# Patient Record
Sex: Male | Born: 1975 | Race: Black or African American | Hispanic: No | Marital: Single | Smoking: Current every day smoker
Health system: Southern US, Community
[De-identification: ages and names within clinical notes are randomized; demographics above are authoritative.]

## PROBLEM LIST (undated history)

## (undated) DIAGNOSIS — I2510 Atherosclerotic heart disease of native coronary artery without angina pectoris: Secondary | ICD-10-CM

## (undated) DIAGNOSIS — B019 Varicella without complication: Secondary | ICD-10-CM

## (undated) DIAGNOSIS — I10 Essential (primary) hypertension: Secondary | ICD-10-CM

## (undated) DIAGNOSIS — C801 Malignant (primary) neoplasm, unspecified: Secondary | ICD-10-CM

## (undated) DIAGNOSIS — N200 Calculus of kidney: Secondary | ICD-10-CM

## (undated) DIAGNOSIS — E785 Hyperlipidemia, unspecified: Secondary | ICD-10-CM

## (undated) HISTORY — DX: Malignant (primary) neoplasm, unspecified: C80.1

## (undated) HISTORY — DX: Hyperlipidemia, unspecified: E78.5

## (undated) HISTORY — DX: Calculus of kidney: N20.0

## (undated) HISTORY — DX: Essential (primary) hypertension: I10

## (undated) HISTORY — DX: Varicella without complication: B01.9

## (undated) HISTORY — PX: HERNIA REPAIR: SHX51

## (undated) HISTORY — DX: Atherosclerotic heart disease of native coronary artery without angina pectoris: I25.10

---

## 1997-10-19 ENCOUNTER — Ambulatory Visit (HOSPITAL_COMMUNITY): Admission: EM | Admit: 1997-10-19 | Discharge: 1997-10-19 | Payer: Self-pay | Admitting: Endocrinology

## 1999-04-13 ENCOUNTER — Emergency Department (HOSPITAL_COMMUNITY): Admission: EM | Admit: 1999-04-13 | Discharge: 1999-04-14 | Payer: Self-pay | Admitting: *Deleted

## 2004-07-07 ENCOUNTER — Emergency Department (HOSPITAL_COMMUNITY): Admission: EM | Admit: 2004-07-07 | Discharge: 2004-07-07 | Payer: Self-pay | Admitting: Emergency Medicine

## 2014-11-13 ENCOUNTER — Ambulatory Visit: Payer: Self-pay | Admitting: Family Medicine

## 2014-11-20 ENCOUNTER — Ambulatory Visit (INDEPENDENT_AMBULATORY_CARE_PROVIDER_SITE_OTHER): Payer: 59 | Admitting: Family Medicine

## 2014-11-20 ENCOUNTER — Encounter: Payer: Self-pay | Admitting: Family Medicine

## 2014-11-20 VITALS — BP 132/90 | HR 78 | Temp 98.5°F | Ht 72.0 in | Wt 200.0 lb

## 2014-11-20 DIAGNOSIS — I10 Essential (primary) hypertension: Secondary | ICD-10-CM | POA: Insufficient documentation

## 2014-11-20 DIAGNOSIS — Z23 Encounter for immunization: Secondary | ICD-10-CM | POA: Diagnosis not present

## 2014-11-20 DIAGNOSIS — Z720 Tobacco use: Secondary | ICD-10-CM | POA: Diagnosis not present

## 2014-11-20 DIAGNOSIS — F17200 Nicotine dependence, unspecified, uncomplicated: Secondary | ICD-10-CM | POA: Insufficient documentation

## 2014-11-20 NOTE — Progress Notes (Signed)
[name], MD Phone: [phone]  Subjective:  Patient presents today to establish care. Pediatrician was last regular doctor. Chief complaint-noted.   Tested for HIV in past- no new partners since testing 13 years ago Doesn't remember last tetanus- given today  exercies 3x a week- 1-5 days a week 20 minutes- jog or walk 7 hours sleep, 4 people in home Overweight - has been in 180s before and feels great  See problem oriented charting Pertinent ROS- no chest pain, shortness of breath, blurry vision, headache, nausea. Some balance issues/dizziness when started new job. Very stressful at first- has improved now/resolved.   The following were reviewed and entered/updated in epic: Past Medical History  Diagnosis Date  . Kidney stone     around age 57, peed them out  . Hypertension     diagnosed at work screenings  . Chicken pox    Patient Active Problem List   Diagnosis Date Noted  . Current smoker 11/20/2014    Priority: High  . Hypertension     Priority: Medium   Past Surgical History  Procedure Laterality Date  . Hernia repair      around 48, bilateral    Family History  Problem Relation Age of Onset  . Stroke Mother     early 67s, drop foot  . Hypertension Mother   . Breast cancer Maternal Aunt     Medications- reviewed and updated No current outpatient prescriptions on file.   No current facility-administered medications for this visit.    Allergies-reviewed and updated No Known Allergies  Social History   Social History  . Marital Status: Single    Spouse Name: N/A  . Number of Children: N/A  . Years of Education: N/A   Social History Main Topics  . Smoking status: Current Every Day Smoker -- 0.60 packs/day for 13 years    Types: Cigarettes  . Smokeless tobacco: Not on file  . Alcohol Use: 3.0 - 6.0 oz/week    5-10 Standard drinks or equivalent per week  . Drug Use: No  . Sexual Activity:    Partners: Female   Other Topics Concern  .  Not on file   Social History Narrative   Family: Single. Girlfriend and 3 kids. 4 people in home. Son that lives in [place]- good situation with former partner.       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       Hobbies: football, basketball with son at park, bowl at the park    ROS--See HPI , otherwise full ROS was completed and negative except as noted above  Objective: BP 132/90 mmHg  Pulse 78  Temp(Src) 98.5 F (36.9 C)  Ht 6' (1.829 m)  Wt 200 lb (90.719 kg)  BMI 27.12 kg/m2 Gen: NAD, resting comfortably HEENT: Mucous membranes are moist. Oropharynx normal. TM normal. Eyes: sclera with some discoloration (since childhood) and lids normal, PERRLA Neck: no thyromegaly, no cervical lymphadenopathy CV: RRR no murmurs rubs or gallops Lungs: CTAB no crackles, wheeze, rhonchi Abdomen: soft/nontender/nondistended/normal bowel sounds. No rebound or guarding.  Ext: no edema, 2+ PT pulses Skin: warm, dry Neuro: 5/5 strength in upper and lower extremities, normal gait, normal reflexes   Assessment/Plan:  Hypertension S: mild poor control. diagnosed at work screenings. Has had 2 checks in December at 141/95 and in June of this year at  140/88 BP Readings from Last 3 Encounters:  11/20/14 132/90  A/P: Mild elevations in office. We are going to target 10 lbs weight down, DASH eating program, exercise 3-5 days a week for 30 minutes, currently 1-5days for20 mins. ecnouraged smoking cessation as well but patient not ready. 3-4 months physical - consider meds if not controlled.    Current smoker S: 1/2 to 3/4 PPD. Not ready to quit A/P: advised cessation. Discussed relation to BP, mother also with CVA so higher risk. Continue to push hard as this is biggest health issues. Never tried to quit so a lot of options here.    3-4 month CPE  Orders Placed This  Encounter  Procedures  . Tdap vaccine greater than or equal to 7yo IM

## 2014-11-20 NOTE — Assessment & Plan Note (Signed)
S: 1/2 to 3/4 PPD. Not ready to quit A/P: advised cessation. Discussed relation to BP, mother also with CVA so higher risk. Continue to push hard as this is biggest health issues. Never tried to quit so a lot of options here.

## 2014-11-20 NOTE — Patient Instructions (Signed)
Medication Instructions:  No medicines needed (yet- need to get blood pressure down)  Other Instructions:  Concerned about your blood pressure- see dash eating plan below, lets work on 3-5 days a week walking for 30 minutes, try to get weight down about 10 lbs  Quitting smoking will help control blood pressure as well. Would strongly consider this. One of the best things you can do for your health.   Labwork: A few days before your physical- schedule today  Testing/Procedures/Immunizations: Declined flu  Tdap (Tetanus shot today)  Follow-Up (all visit scheduling, rescheduling, cancellations including labs should be scheduled at front desk): Physical about 3-4 months from now  Sooner if you need [place] or if you have new or worsening symptoms    DASH Eating Plan DASH stands for "Dietary Approaches to Stop Hypertension." The DASH eating plan is a healthy eating plan that has been shown to reduce high blood pressure (hypertension). Additional health benefits may include reducing the risk of type 2 diabetes mellitus, heart disease, and stroke. The DASH eating plan may also help with weight loss. WHAT DO I NEED TO KNOW ABOUT THE DASH EATING PLAN? For the DASH eating plan, you will follow these general guidelines:  Choose foods with a percent daily value for sodium of less than 5% (as listed on the food label).  Use salt-free seasonings or herbs instead of table salt or sea salt.  Check with your health care provider or pharmacist before using salt substitutes.  Eat lower-sodium products, often labeled as "lower sodium" or "no salt added."  Eat fresh foods.  Eat more vegetables, fruits, and low-fat dairy products.  Choose whole grains. Look for the word "whole" as the first word in the ingredient list.  Choose fish and skinless chicken or [place] more often than red meat. Limit fish, poultry, and meat to 6 oz (170 g) each day.  Limit sweets, desserts, sugars, and sugary  drinks.  Choose heart-healthy fats.  Limit cheese to 1 oz (28 g) per day.  Eat more home-cooked food and less restaurant, buffet, and fast food.  Limit fried foods.  Cook foods using methods other than frying.  Limit canned vegetables. If you do use them, rinse them well to decrease the sodium.  When eating at a restaurant, ask that your food be prepared with less salt, or no salt if possible. WHAT FOODS CAN I EAT? Seek help from a dietitian for individual calorie needs. Grains Whole grain or whole wheat bread. Brown rice. Whole grain or whole wheat pasta. Quinoa, bulgur, and whole grain cereals. Low-sodium cereals. Corn or whole wheat flour tortillas. Whole grain cornbread. Whole grain crackers. Low-sodium crackers. Vegetables Fresh or frozen vegetables (raw, steamed, roasted, or grilled). Low-sodium or reduced-sodium tomato and vegetable juices. Low-sodium or reduced-sodium tomato sauce and paste. Low-sodium or reduced-sodium canned vegetables.  Fruits All fresh, canned (in natural juice), or frozen fruits. Meat and Other Protein Products Ground beef (85% or leaner), grass-fed beef, or beef trimmed of fat. Skinless chicken or [place]. Ground chicken or [place]. Pork trimmed of fat. All fish and seafood. Eggs. Dried beans, peas, or lentils. Unsalted nuts and seeds. Unsalted canned beans. Dairy Low-fat dairy products, such as skim or 1% milk, 2% or reduced-fat cheeses, low-fat ricotta or cottage cheese, or plain low-fat yogurt. Low-sodium or reduced-sodium cheeses. Fats and Oils Tub margarines without trans fats. Light or reduced-fat mayonnaise and salad dressings (reduced sodium). Avocado. Safflower, olive, or canola oils. Natural peanut or almond butter. Other Unsalted popcorn  and pretzels. The items listed above may not be a complete list of recommended foods or beverages. Contact your dietitian for more options. WHAT FOODS ARE NOT RECOMMENDED? Grains White bread. White pasta.  White rice. Refined cornbread. Bagels and croissants. Crackers that contain trans fat. Vegetables Creamed or fried vegetables. Vegetables in a cheese sauce. Regular canned vegetables. Regular canned tomato sauce and paste. Regular tomato and vegetable juices. Fruits Dried fruits. Canned fruit in light or heavy syrup. Fruit juice. Meat and Other Protein Products Fatty cuts of meat. Ribs, chicken wings, bacon, sausage, bologna, salami, chitterlings, fatback, hot dogs, bratwurst, and packaged luncheon meats. Salted nuts and seeds. Canned beans with salt. Dairy Whole or 2% milk, cream, half-and-half, and cream cheese. Whole-fat or sweetened yogurt. Full-fat cheeses or blue cheese. Nondairy creamers and whipped toppings. Processed cheese, cheese spreads, or cheese curds. Condiments Onion and garlic salt, seasoned salt, table salt, and sea salt. Canned and packaged gravies. Worcestershire sauce. Tartar sauce. Barbecue sauce. Teriyaki sauce. Soy sauce, including reduced sodium. Steak sauce. Fish sauce. Oyster sauce. Cocktail sauce. Horseradish. Ketchup and mustard. Meat flavorings and tenderizers. Bouillon cubes. Hot sauce. Tabasco sauce. Marinades. Taco seasonings. Relishes. Fats and Oils Butter, stick margarine, lard, shortening, ghee, and bacon fat. Coconut, palm kernel, or palm oils. Regular salad dressings. Other Pickles and olives. Salted popcorn and pretzels. The items listed above may not be a complete list of foods and beverages to avoid. Contact your dietitian for more information. WHERE CAN I FIND MORE INFORMATION? National Heart, Lung, and Blood Institute: travelstabloid.com Document Released: 02/25/2011 Document Revised: 07/23/2013 Document Reviewed: 01/10/2013 [facility] Patient Information 2015 [place], [place]. This information is not intended to replace advice given to you by your health care provider. Make sure you discuss any questions you have with your  health care provider.

## 2014-11-20 NOTE — Assessment & Plan Note (Signed)
S: mild poor control. diagnosed at work screenings. Has had 2 checks in December at 141/95 and in June of this year at 140/88 BP Readings from Last 3 Encounters:  11/20/14 132/90  A/P: Mild elevations in office. We are going to target 10 lbs weight down, DASH eating program, exercise 3-5 days a week for 30 minutes, currently 1-5days for20 mins. ecnouraged smoking cessation as well but patient not ready. 3-4 months physical - consider meds if not controlled.

## 2015-02-26 ENCOUNTER — Other Ambulatory Visit: Payer: 59

## 2015-03-06 ENCOUNTER — Encounter: Payer: Self-pay | Admitting: Family Medicine

## 2015-03-06 ENCOUNTER — Other Ambulatory Visit (INDEPENDENT_AMBULATORY_CARE_PROVIDER_SITE_OTHER): Payer: 59

## 2015-03-06 ENCOUNTER — Ambulatory Visit (INDEPENDENT_AMBULATORY_CARE_PROVIDER_SITE_OTHER): Payer: 59 | Admitting: Family Medicine

## 2015-03-06 VITALS — BP 142/88 | HR 98 | Temp 98.4°F | Ht 72.0 in | Wt 196.0 lb

## 2015-03-06 DIAGNOSIS — Z0000 Encounter for general adult medical examination without abnormal findings: Secondary | ICD-10-CM | POA: Diagnosis not present

## 2015-03-06 DIAGNOSIS — E785 Hyperlipidemia, unspecified: Secondary | ICD-10-CM | POA: Insufficient documentation

## 2015-03-06 LAB — HEPATIC FUNCTION PANEL
ALT: 49 U/L (ref 0–53)
AST: 25 U/L (ref 0–37)
Albumin: 4.8 g/dL (ref 3.5–5.2)
Alkaline Phosphatase: 69 U/L (ref 39–117)
Bilirubin, Direct: 0.1 mg/dL (ref 0.0–0.3)
TOTAL PROTEIN: 7.2 g/dL (ref 6.0–8.3)
Total Bilirubin: 0.5 mg/dL (ref 0.2–1.2)

## 2015-03-06 LAB — CBC WITH DIFFERENTIAL/PLATELET
BASOS PCT: 0.7 % (ref 0.0–3.0)
Basophils Absolute: 0.1 10*3/uL (ref 0.0–0.1)
EOS PCT: 2.4 % (ref 0.0–5.0)
Eosinophils Absolute: 0.2 10*3/uL (ref 0.0–0.7)
HEMATOCRIT: 48.9 % (ref 39.0–52.0)
HEMOGLOBIN: 16.2 g/dL (ref 13.0–17.0)
LYMPHS PCT: 20.8 % (ref 12.0–46.0)
Lymphs Abs: 1.4 10*3/uL (ref 0.7–4.0)
MCHC: 33.1 g/dL (ref 30.0–36.0)
MCV: 85.7 fl (ref 78.0–100.0)
MONOS PCT: 9.5 % (ref 3.0–12.0)
Monocytes Absolute: 0.6 10*3/uL (ref 0.1–1.0)
Neutro Abs: 4.5 10*3/uL (ref 1.4–7.7)
Neutrophils Relative %: 66.6 % (ref 43.0–77.0)
Platelets: 310 10*3/uL (ref 150.0–400.0)
RBC: 5.71 Mil/uL (ref 4.22–5.81)
RDW: 14.4 % (ref 11.5–15.5)
WBC: 6.8 10*3/uL (ref 4.0–10.5)

## 2015-03-06 LAB — POCT URINALYSIS DIPSTICK
Bilirubin, UA: NEGATIVE
Glucose, UA: NEGATIVE
KETONES UA: NEGATIVE
LEUKOCYTES UA: NEGATIVE
Nitrite, UA: NEGATIVE
PH UA: 5.5
SPEC GRAV UA: 1.025
Urobilinogen, UA: 0.2

## 2015-03-06 LAB — LIPID PANEL
Cholesterol: 252 mg/dL — ABNORMAL HIGH (ref 0–200)
HDL: 43.1 mg/dL (ref >39.00)
LDL Cholesterol: 188 mg/dL — ABNORMAL HIGH (ref 0–99)
NONHDL: 208.97
Total CHOL/HDL Ratio: 6
Triglycerides: 107 mg/dL (ref 0.0–149.0)
VLDL: 21.4 mg/dL (ref 0.0–40.0)

## 2015-03-06 LAB — BASIC METABOLIC PANEL
BUN: 13 mg/dL (ref 6–23)
CHLORIDE: 104 meq/L (ref 96–112)
CO2: 31 mEq/L (ref 19–32)
Calcium: 10 mg/dL (ref 8.4–10.5)
Creatinine, Ser: 1.11 mg/dL (ref 0.40–1.50)
GFR: 94.55 mL/min (ref >60.00)
Glucose, Bld: 102 mg/dL — ABNORMAL HIGH (ref 70–99)
POTASSIUM: 4.9 meq/L (ref 3.5–5.1)
SODIUM: 141 meq/L (ref 135–145)

## 2015-03-06 LAB — TSH: TSH: 1.41 u[IU]/mL (ref 0.35–4.50)

## 2015-03-06 NOTE — Patient Instructions (Addendum)
Blood pressure remains high. Great job losing 4 lbs and exercising. The next step is to quit smoking and I am hopeful your blood pressure will be controlled.   If you are going to use gum Use 41m patch daily for a month, then 73mpatch for next month, then stop.   With this, can use up to 4-5 pieces of nicotine gum. After done with patch, try to slowly stop the gum as well  Cholesterol also high and at risk for diabetes- we have to quit smoking to reduce your risk for heart attack and stroke

## 2015-03-06 NOTE — Progress Notes (Signed)
Daryl Reddish, MD Phone: [phone]  Subjective:  Patient presents today for their annual physical. Chief complaint-noted.   See problem oriented charting- ROS- full  review of systems was completed and negative including No chest pain or shortness of breath. No headache or blurry vision.   The following were reviewed and entered/updated in epic: Past Medical History  Diagnosis Date  . Kidney stone     around age 39, peed them out  . Hypertension     diagnosed at work screenings  . Chicken pox    Patient Active Problem List   Diagnosis Date Noted  . Current smoker 11/20/2014    Priority: High  . Hyperlipidemia 03/06/2015    Priority: Medium  . Hypertension     Priority: Medium   Past Surgical History  Procedure Laterality Date  . Hernia repair      around 53, bilateral    Family History  Problem Relation Age of Onset  . Stroke Mother     early 60s, drop foot  . Hypertension Mother   . Breast cancer Maternal Aunt     Medications- reviewed and updated No current outpatient prescriptions on file.   No current facility-administered medications for this visit.    Allergies-reviewed and updated No Known Allergies  Social History   Social History  . Marital Status: Single    Spouse Name: N/A  . Number of Children: N/A  . Years of Education: N/A   Social History Main Topics  . Smoking status: Current Every Day Smoker -- 0.60 packs/day for 13 years    Types: Cigarettes  . Smokeless tobacco: None  . Alcohol Use: 3.0 - 6.0 oz/week    5-10 Standard drinks or equivalent per week  . Drug Use: No  . Sexual Activity:    Partners: Female   Other Topics Concern  . None   Social History Narrative   Family: Single. Girlfriend and 3 kids. 4 people in home. Son that lives in [place]- good situation with former partner.       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       Hobbies: football, basketball with son at park, bowl at the park    ROS--See HPI   Objective: BP 142/88 mmHg  Pulse 98  Temp(Src) 98.4 F (36.9 C)  Ht 6' (1.829 m)  Wt 196 lb (88.905 kg)  BMI 26.58 kg/m2 Gen: NAD, resting comfortably HEENT: Mucous membranes are moist. Oropharynx normal Neck: no thyromegaly CV: RRR no murmurs rubs or gallops Lungs: CTAB no crackles, wheeze, rhonchi Abdomen: soft/nontender/nondistended/normal bowel sounds. No rebound or guarding.  Ext: no edema Skin: warm, dry Neuro: grossly normal, moves all extremities, PERRLA  Assessment/Plan:  39 y.o. male presenting for annual physical.  Health Maintenance counseling: 1. Anticipatory guidance: Patient counseled regarding regular dental exams, eye exams, wearing seatbelts.  2. Risk factor reduction:  Advised patient of need for regular exercise and diet rich and fruits and vegetables to reduce risk of heart attack and stroke. 3 days a week over an hour. Carb heavy diet is going to work on. But not a lot of simple sugars 3. Immunizations/screenings/ancillary studies- declined flu Immunization History  Administered Date(s) Administered  . Tdap 11/20/2014  4. Prostate cancer screening- start at age 51  5. Colon cancer screening - start at age 18  Smoker- not ready to quit.  Strongly encouraged given risks- HLD, hyperglycemia, smoker, hypertnesion. AVS plan-giving 4 months to quit or start BP meds if still high  Diastolic BP better with weight loss but SBP slightly higher BP Readings from Last 3 Encounters:  03/06/15 142/88  11/20/14 132/90   Wt Readings from Last 3 Encounters:  03/06/15 196 lb (88.905 kg)  11/20/14 200 lb (90.719 kg)   4 month follow up Repeat UA at visit under hematuria

## 2015-06-29 ENCOUNTER — Emergency Department (HOSPITAL_COMMUNITY)
Admission: EM | Admit: 2015-06-29 | Discharge: 2015-06-29 | Disposition: A | Discharge status: Home / Self Care | Payer: 59 | Attending: Emergency Medicine | Admitting: Emergency Medicine

## 2015-06-29 ENCOUNTER — Encounter (HOSPITAL_COMMUNITY): Payer: Self-pay | Admitting: Nurse Practitioner

## 2015-06-29 ENCOUNTER — Emergency Department (HOSPITAL_COMMUNITY): Payer: 59

## 2015-06-29 DIAGNOSIS — R002 Palpitations: Secondary | ICD-10-CM | POA: Insufficient documentation

## 2015-06-29 DIAGNOSIS — Z87442 Personal history of urinary calculi: Secondary | ICD-10-CM | POA: Diagnosis not present

## 2015-06-29 DIAGNOSIS — R0789 Other chest pain: Secondary | ICD-10-CM | POA: Diagnosis not present

## 2015-06-29 DIAGNOSIS — I10 Essential (primary) hypertension: Secondary | ICD-10-CM | POA: Diagnosis not present

## 2015-06-29 DIAGNOSIS — M791 Myalgia: Secondary | ICD-10-CM | POA: Diagnosis not present

## 2015-06-29 DIAGNOSIS — Z8619 Personal history of other infectious and parasitic diseases: Secondary | ICD-10-CM | POA: Diagnosis not present

## 2015-06-29 DIAGNOSIS — F17210 Nicotine dependence, cigarettes, uncomplicated: Secondary | ICD-10-CM | POA: Diagnosis not present

## 2015-06-29 DIAGNOSIS — R110 Nausea: Secondary | ICD-10-CM | POA: Diagnosis not present

## 2015-06-29 DIAGNOSIS — M25512 Pain in left shoulder: Secondary | ICD-10-CM | POA: Insufficient documentation

## 2015-06-29 DIAGNOSIS — R079 Chest pain, unspecified: Secondary | ICD-10-CM | POA: Diagnosis present

## 2015-06-29 LAB — BASIC METABOLIC PANEL
ANION GAP: 10 (ref 5–15)
BUN: 12 mg/dL (ref 6–20)
CALCIUM: 9.6 mg/dL (ref 8.9–10.3)
CO2: 27 mmol/L (ref 22–32)
CREATININE: 1.03 mg/dL (ref 0.61–1.24)
Chloride: 103 mmol/L (ref 101–111)
Glucose, Bld: 110 mg/dL — ABNORMAL HIGH (ref 65–99)
Potassium: 3.7 mmol/L (ref 3.5–5.1)
SODIUM: 140 mmol/L (ref 135–145)

## 2015-06-29 LAB — CBC
HCT: 46.5 % (ref 39.0–52.0)
Hemoglobin: 15.3 g/dL (ref 13.0–17.0)
MCH: 28.4 pg (ref 26.0–34.0)
MCHC: 32.9 g/dL (ref 30.0–36.0)
MCV: 86.3 fL (ref 78.0–100.0)
Platelets: 281 K/uL (ref 150–400)
RBC: 5.39 MIL/uL (ref 4.22–5.81)
RDW: 14.2 % (ref 11.5–15.5)
WBC: 5.6 K/uL (ref 4.0–10.5)

## 2015-06-29 LAB — I-STAT TROPONIN, ED: Troponin i, poc: 0 ng/mL (ref 0.00–0.08)

## 2015-06-29 MED ORDER — IBUPROFEN 200 MG PO TABS
400.0000 mg | ORAL_TABLET | Freq: Once | ORAL | Status: AC
  Administered 2015-06-29: 400 mg via ORAL
  Filled 2015-06-29: qty 2

## 2015-06-29 MED ORDER — LORAZEPAM 1 MG PO TABS
0.5000 mg | ORAL_TABLET | Freq: Once | ORAL | Status: AC
  Administered 2015-06-29: 0.5 mg via ORAL
  Filled 2015-06-29: qty 1

## 2015-06-29 NOTE — Discharge Instructions (Signed)

## 2015-06-29 NOTE — ED Provider Notes (Signed)
CSN: [id number]     Arrival date & time 06/29/15  1229 History   First MD Initiated Contact with Patient 06/29/15 1236     Chief Complaint  Patient presents with  . Chest Pain    HPI Comments: 40 year old male who presents with chest pain since 7PM last night. He states it is on the left side of his chest and radiates to his shoulder. It feels like a tightness and sometimes a pinch. Reports associated nausea and the feeling of his heart fluttering. He states he thought his symptoms may be related to gas so took gas relief med which did not provide relief. He states he went to a funeral yesterday for his step-mom and started to feel flushed when driving home. Denies feeling anxious. Denies fever, chills, diaphoresis, URI symptoms, SOB, abdominal pain, vomiting, or diarrhea. No family hx of CAD/MI. Pt does have HTN and HLD which he is currently not being treated for and is a current smoker.   Patient is a 40 y.o. male presenting with chest pain.  Chest Pain Associated symptoms: nausea and palpitations   Associated symptoms: no abdominal pain, no cough, no diaphoresis, no fever, no shortness of breath and not vomiting     Past Medical History  Diagnosis Date  . Kidney stone     around age 63, peed them out  . Hypertension     diagnosed at work screenings  . Chicken pox    Past Surgical History  Procedure Laterality Date  . Hernia repair      around 39, bilateral   Family History  Problem Relation Age of Onset  . Stroke Mother     early 55s, drop foot  . Hypertension Mother   . Breast cancer Maternal Aunt    Social History  Substance Use Topics  . Smoking status: Current Every Day Smoker -- 0.60 packs/day for 13 years    Types: Cigarettes  . Smokeless tobacco: None  . Alcohol Use: 3.0 - 6.0 oz/week    5-10 Standard drinks or equivalent per week    Review of Systems  Constitutional: Negative for fever, chills and diaphoresis.  Respiratory: Negative for cough and shortness of  breath.   Cardiovascular: Positive for chest pain and palpitations. Negative for leg swelling.  Gastrointestinal: Positive for nausea. Negative for vomiting, abdominal pain and diarrhea.  Musculoskeletal: Positive for myalgias.  Psychiatric/Behavioral: The patient is not nervous/anxious.   All other systems reviewed and are negative.   Allergies  Review of patient's allergies indicates no known allergies.  Home Medications   Prior to Admission medications   Not on File   BP 159/97 mmHg  Pulse 92  Temp(Src) 98.1 F (36.7 C) (Oral)  Resp 17  SpO2 97%   Physical Exam  Constitutional: He is oriented to person, place, and time. He appears well-developed and well-nourished. No distress.  HENT:  Head: Normocephalic and atraumatic.  Eyes: Conjunctivae are normal. Pupils are equal, round, and reactive to light. Right eye exhibits no discharge. Left eye exhibits no discharge. No scleral icterus.  Neck: Normal range of motion. No JVD present.  Cardiovascular: Normal rate and regular rhythm.  Exam reveals no gallop and no friction rub.   No murmur heard. Pulmonary/Chest: Effort normal. No respiratory distress. He has no wheezes. He has no rales. He exhibits no tenderness.  Abdominal: Soft. Bowel sounds are normal. He exhibits no distension and no mass. There is no tenderness. There is no rebound and no guarding.  Musculoskeletal:  L shoulder: Tenderness to palpation over the L biceps tendon and L chest wall  Neurological: He is alert and oriented to person, place, and time.  Skin: Skin is warm and dry. He is not diaphoretic.  Psychiatric: He has a normal mood and affect.    ED Course  Procedures (including critical care time) Labs Review Labs Reviewed  BASIC METABOLIC PANEL - Abnormal; Notable for the following:    Glucose, Bld 110 (*)    All other components within normal limits  CBC  I-STAT TROPOININ, ED   Imaging Review Dg Chest 2 View  06/29/2015  CLINICAL DATA:  Left-sided  chest pain for 1 day. No shortness of breath. EXAM: CHEST  2 VIEW COMPARISON:  07/07/2004 FINDINGS: The heart size and mediastinal contours are within normal limits. Both lungs are clear. The visualized skeletal structures are unremarkable. IMPRESSION: No active cardiopulmonary disease. Electronically Signed   By: Kathreen Devoid   On: 06/29/2015 13:10   I have personally reviewed and evaluated these images and lab results as part of my medical decision-making.   EKG Interpretation None     Meds given in ED:  Medications  LORazepam (ATIVAN) tablet 0.5 mg (0.5 mg Oral Given 06/29/15 1410)  ibuprofen (ADVIL,MOTRIN) tablet 400 mg (400 mg Oral Given 06/29/15 1409)    New Prescriptions   No medications on file     MDM   Final diagnoses:  Atypical chest pain   40 year old male who presents with chest pain since last night. It has atypical features and is reproducible on exam. It's constant, non-exertional. Ativan and Ibuprofen given with moderate relief. Advised to follow up with his PCP. He is non-toxic, NAD. He is hypertensive which he states is being watched by his PCP. Otherwise has stable vital signs. Heart score is 2. Recommended course of NSAIDs for 5 days. Patient informed of clinical course, understand medical decision-making process, and agree with plan. Return precautions given.    Recardo Evangelist, PA-C 06/29/15 [place], MD 06/29/15 [phone]

## 2015-06-29 NOTE — ED Notes (Signed)
Pt c/o 1 day history of fatigue, chest pain, belching, feeling "butterflies" in his chest. Onset of symptoms after he was at a funeral. Symptoms have been constant. Nothing alleviates or worsens the symptoms. He is A&Ox4, resp e/u

## 2015-07-10 ENCOUNTER — Ambulatory Visit (INDEPENDENT_AMBULATORY_CARE_PROVIDER_SITE_OTHER): Payer: 59 | Admitting: Family Medicine

## 2015-07-10 ENCOUNTER — Encounter: Payer: Self-pay | Admitting: Family Medicine

## 2015-07-10 VITALS — BP 140/80 | HR 78 | Temp 99.0°F | Wt 201.0 lb

## 2015-07-10 DIAGNOSIS — R0789 Other chest pain: Secondary | ICD-10-CM

## 2015-07-10 DIAGNOSIS — R319 Hematuria, unspecified: Secondary | ICD-10-CM

## 2015-07-10 DIAGNOSIS — I10 Essential (primary) hypertension: Secondary | ICD-10-CM

## 2015-07-10 DIAGNOSIS — Z720 Tobacco use: Secondary | ICD-10-CM

## 2015-07-10 DIAGNOSIS — F17200 Nicotine dependence, unspecified, uncomplicated: Secondary | ICD-10-CM

## 2015-07-10 LAB — POC URINALSYSI DIPSTICK (AUTOMATED)
Bilirubin, UA: NEGATIVE
Glucose, UA: NEGATIVE
Ketones, UA: NEGATIVE
LEUKOCYTES UA: NEGATIVE
NITRITE UA: NEGATIVE
Spec Grav, UA: 1.02
UROBILINOGEN UA: 0.2
pH, UA: 6

## 2015-07-10 LAB — URINALYSIS, MICROSCOPIC ONLY

## 2015-07-10 MED ORDER — NICOTINE POLACRILEX 2 MG MT GUM: 2.0000 mg | CHEWING_GUM | OROMUCOSAL | Status: DC | PRN

## 2015-07-10 NOTE — Assessment & Plan Note (Addendum)
S: Patient went to ED with msk chest pain but was concerned about his heart, we used this as a big talking point how quitting smoking is the best thing he could do for his heart. In addition does not want to be on BP meds and quitting smoking would reduce his risk. Extended counseling, motivation still lower than hoped A/P: patient at pack per 3 days. He is willing to trial nicotine gum with quit date trial of tomorrow. We discussed wellbutrin as option as well. See avs.  6 week follow up.

## 2015-07-10 NOTE — Assessment & Plan Note (Signed)
S: poorly controlled on no medication BP Readings from Last 3 Encounters:  07/10/15 140/80  06/29/15 122/77  03/06/15 142/88  A/P:Continue Without medication. We are going to focus on quitting smoking and see if this helps his blood pressure. We discussed is unable to quit smoking likely would need to start medication. We discussed cardiac risk is primarily elevated by his blood pressure and smoking.

## 2015-07-10 NOTE — Progress Notes (Signed)
[name], MD  Subjective:  Daryl Pace is a 40 y.o. year old very pleasant male patient who presents for/with See problem oriented charting ROS- see ros below  Past Medical History-  Patient Active Problem List   Diagnosis Date Noted  . Current smoker 11/20/2014    Priority: High  . Hyperlipidemia 03/06/2015    Priority: Medium  . Hypertension     Priority: Medium  . Atypical chest pain 07/10/2015    Medications- reviewed and updated, none prior to visit  Objective: BP 140/80 mmHg  Pulse 78  Temp(Src) 99 F (37.2 C)  Wt 201 lb (91.173 kg) Gen: NAD, resting comfortably CV: RRR no murmurs rubs or gallops Mild pain in left  shoulder anteriorly with deep palpation. Lungs: CTAB no crackles, wheeze, rhonchi Abdomen: soft/nontender/nondistended/normal bowel sounds. No rebound or guarding.  Ext: no edema, no calf tenderness or swelling Skin: warm, dry Neuro: grossly normal, moves all extremities  Assessment/Plan:  Current smoker S: Patient went to ED with msk chest pain but was concerned about his heart, we used this as a big talking point how quitting smoking is the best thing he could do for his heart. In addition does not want to be on BP meds and quitting smoking would reduce his risk. Extended counseling, motivation still lower than hoped A/P: patient at pack per 3 days. He is willing to trial nicotine gum with quit date trial of tomorrow. We discussed wellbutrin as option as well. See avs.  6 week follow up.   Hypertension S: poorly controlled on no medication BP Readings from Last 3 Encounters:  07/10/15 140/80  06/29/15 122/77  03/06/15 142/88  A/P:Continue Without medication. We are going to focus on quitting smoking and see if this helps his blood pressure. We discussed is unable to quit smoking likely would need to start medication. We discussed cardiac risk is primarily elevated by his blood pressure and smoking.  Atypical chest pain S: patient was  seen in the emergency room less than 2 weeks ago for atypical chest pain. He had been at a family member's funeral and when he drove home he said he felt butterflies in his stomach and rather anxious. That evening he woke up with pain in his left shoulder anteriorly which he described as left upper chest. He was seen in emergency room and had a EKG which did not show STEMI.One-time troponin was negative. Pain was reproducible on exam and patient did admit when he sleeps on his left side he tends to get pain in that area it just seemed more intense. He has taken NSAIDs which have drastically improved pain. If He avoids sleeping on it he does not feel that pain ROS- No other symptoms including shortness of breath, diaphoresis, nausea, lightheadedness, left arm or neck pain.  A/P: Suspect musculoskeletal in origin. With his risk factors we discussed doing a stress test is mind. Patient declined but will let me know if he changes his mind. We will seek to lower his risk factors as already noted. Extensive counseling on decision making was needed    Return in about 4 weeks (around 08/07/2015). Return precautions advised.   Orders Placed This Encounter  Procedures  . Urine Microscopic  . POCT Urinalysis Dipstick (Automated)  no hematuria on microscopic  Meds ordered this encounter  Medications  . nicotine polacrilex ([employer] NICOTINE POLACRILEX) 2 MG gum    Sig: Take 1 each (2 mg total) by mouth as needed for smoking cessation (5 max  per day).    Dispense:  100 tablet    Refill:  0   The duration of face-to-face time during this visit was 25 minutes. Greater than 50% of this time was spent in counseling, explanation of diagnosis, planning of further management, and/or coordination of care.

## 2015-07-10 NOTE — Patient Instructions (Addendum)
You declined stress testing given your pain is reproducible and only occurs when you lay on that side.   The biggest thing you can do to protect your heart is to Hyder. Great job cutting back to 1 pack per 3 days. You may use up to 5 pieces of gum a day (84m gum) to help you further lower amount you are smoking. Quitting cold tKuwaittends to work the best though.   See me within 6 weeks to check in.   If we are not able to get blood pressure down with further with quitting smoking or if you cannot quit smoking, will have to recommend blood pressure medicine  Drop off a urine before you leave. There was possible blood last time.

## 2015-07-10 NOTE — Assessment & Plan Note (Signed)
S: patient was seen in the emergency room less than 2 weeks ago for atypical chest pain. He had been at a family member's funeral and when he drove home he said he felt butterflies in his stomach and rather anxious. That evening he woke up with pain in his left shoulder anteriorly which he described as left upper chest. He was seen in emergency room and had a EKG which did not show STEMI.One-time troponin was negative. Pain was reproducible on exam and patient did admit when he sleeps on his left side he tends to get pain in that area it just seemed more intense. He has taken NSAIDs which have drastically improved pain. If He avoids sleeping on it he does not feel that pain ROS- No other symptoms including shortness of breath, diaphoresis, nausea, lightheadedness, left arm or neck pain.  A/P: Suspect musculoskeletal in origin. With his risk factors we discussed doing a stress test is mind. Patient declined but will let me know if he changes his mind. We will seek to lower his risk factors as already noted. Extensive counseling on decision making was needed

## 2015-08-08 ENCOUNTER — Ambulatory Visit: Payer: 59 | Admitting: Family Medicine

## 2015-09-30 ENCOUNTER — Emergency Department (HOSPITAL_COMMUNITY)
Admission: EM | Admit: 2015-09-30 | Discharge: 2015-09-30 | Disposition: A | Discharge status: Home / Self Care | Payer: 59 | Attending: Emergency Medicine | Admitting: Emergency Medicine

## 2015-09-30 ENCOUNTER — Encounter (HOSPITAL_COMMUNITY): Payer: Self-pay | Admitting: Emergency Medicine

## 2015-09-30 DIAGNOSIS — Y999 Unspecified external cause status: Secondary | ICD-10-CM | POA: Insufficient documentation

## 2015-09-30 DIAGNOSIS — X58XXXA Exposure to other specified factors, initial encounter: Secondary | ICD-10-CM | POA: Diagnosis not present

## 2015-09-30 DIAGNOSIS — Y939 Activity, unspecified: Secondary | ICD-10-CM | POA: Insufficient documentation

## 2015-09-30 DIAGNOSIS — F17210 Nicotine dependence, cigarettes, uncomplicated: Secondary | ICD-10-CM | POA: Insufficient documentation

## 2015-09-30 DIAGNOSIS — S3992XA Unspecified injury of lower back, initial encounter: Secondary | ICD-10-CM | POA: Diagnosis present

## 2015-09-30 DIAGNOSIS — Y929 Unspecified place or not applicable: Secondary | ICD-10-CM | POA: Insufficient documentation

## 2015-09-30 DIAGNOSIS — S39012A Strain of muscle, fascia and tendon of lower back, initial encounter: Secondary | ICD-10-CM | POA: Diagnosis not present

## 2015-09-30 DIAGNOSIS — I10 Essential (primary) hypertension: Secondary | ICD-10-CM | POA: Insufficient documentation

## 2015-09-30 MED ORDER — KETOROLAC TROMETHAMINE 60 MG/2ML IM SOLN
30.0000 mg | Freq: Once | INTRAMUSCULAR | Status: AC
  Administered 2015-09-30: 30 mg via INTRAMUSCULAR
  Filled 2015-09-30: qty 2

## 2015-09-30 MED ORDER — HYDROCODONE-ACETAMINOPHEN 5-325 MG PO TABS: ORAL_TABLET | ORAL | Status: DC

## 2015-09-30 MED ORDER — METHOCARBAMOL 500 MG PO TABS: 500.0000 mg | ORAL_TABLET | Freq: Two times a day (BID) | ORAL | Status: DC | PRN

## 2015-09-30 NOTE — ED Notes (Signed)
Pt states 1 week ago he was trying to put his sock on and pulled something in his lower back. Pt states the pain is no better.

## 2015-09-30 NOTE — ED Provider Notes (Signed)
CSN: [id number]     Arrival date & time 09/30/15  1605 History   First MD Initiated Contact with Patient 09/30/15 1809     Chief Complaint  Patient presents with  . Back Pain     (Consider location/radiation/quality/duration/timing/severity/associated sxs/prior Treatment) HPI   Blood pressure 134/77, pulse 75, temperature 98.6 F (37 C), temperature source Oral, resp. rate 18, SpO2 99 %.  Daryl Pace is a 40 y.o. male complaining of low back pain and bilateral lower back does not radiate down the legs onset 1 week ago after patient was leaning forward to put a sock on and felt immediate pain. He has been taking any pain medication at home. Denies fever, chills, change in bowel or bladder habits, h/o IDVU or cancer, numbness or weakness.   Past Medical History  Diagnosis Date  . Kidney stone     around age 31, peed them out  . Hypertension     diagnosed at work screenings  . Chicken pox    Past Surgical History  Procedure Laterality Date  . Hernia repair      around 8, bilateral   Family History  Problem Relation Age of Onset  . Stroke Mother     early 26s, drop foot  . Hypertension Mother   . Breast cancer Maternal Aunt    Social History  Substance Use Topics  . Smoking status: Current Every Day Smoker -- 0.60 packs/day for 13 years    Types: Cigarettes  . Smokeless tobacco: None  . Alcohol Use: 3.0 - 6.0 oz/week    5-10 Standard drinks or equivalent per week    Review of Systems  10 systems reviewed and found to be negative, except as noted in the HPI.   Allergies  Review of patient's allergies indicates no known allergies.  Home Medications   Prior to Admission medications   Medication Sig Start Date End Date Taking? Authorizing Provider  HYDROcodone-acetaminophen (NORCO/VICODIN) 5-325 MG tablet Take 1-2 tablets by mouth every 6 hours as needed for pain. 09/30/15   Korinna Tat, PA-C  methocarbamol (ROBAXIN) 500 MG tablet Take 1 tablet (500 mg  total) by mouth 2 (two) times daily as needed for muscle spasms. 09/30/15   Briggette Najarian, PA-C  nicotine polacrilex ([employer] NICOTINE POLACRILEX) 2 MG gum Take 1 each (2 mg total) by mouth as needed for smoking cessation (5 max per day). 07/10/15   Marin Olp, MD   BP 134/77 mmHg  Pulse 75  Temp(Src) 98.6 F (37 C) (Oral)  Resp 18  SpO2 99% Physical Exam  Constitutional: He appears well-developed and well-nourished.  HENT:  Head: Normocephalic.  Eyes: Conjunctivae are normal.  Neck: Normal range of motion.  Cardiovascular: Normal rate, regular rhythm and intact distal pulses.   Pulmonary/Chest: Effort normal.  Abdominal: Soft. There is no tenderness.  Neurological: He is alert.  No point tenderness to percussion of lumbar spinal processes.  No TTP or paraspinal muscular spasm. Strength is 5 out of 5 to bilateral lower extremities at hip and knee; extensor hallucis longus 5 out of 5. Ankle strength 5 out of 5, no clonus, neurovascularly intact. No saddle anaesthesia. Patellar reflexes are 2+ bilaterally.     Psychiatric: He has a normal mood and affect.  Nursing note and vitals reviewed.   ED Course  Procedures (including critical care time) Labs Review Labs Reviewed - No data to display  Imaging Review No results found. I have personally reviewed and evaluated these images and lab  results as part of my medical decision-making.   EKG Interpretation None      MDM   Final diagnoses:  Lumbar strain, initial encounter   Filed Vitals:   09/30/15 1641 09/30/15 1852  BP: 146/90 134/77  Pulse: 99 75  Temp: 98.6 F (37 C)   TempSrc: Oral   Resp: 18 18  SpO2: 98% 99%    Medications  ketorolac (TORADOL) injection 30 mg (30 mg Intramuscular Given 09/30/15 1904)    Havoc Sanluis is 40 y.o. male presenting with  back pain.  No neurological deficits and normal neuro exam.  Patient can walk but states is painful.  No loss of bowel or bladder control.  No concern for  cauda equina.  No fever, night sweats, weight loss, h/o cancer, IVDU.  RICE protocol and pain medicine indicated and discussed with patient.  Evaluation does not show pathology that would require ongoing emergent intervention or inpatient treatment. Pt is hemodynamically stable and mentating appropriately. Discussed findings and plan with patient/guardian, who agrees with care plan. All questions answered. Return precautions discussed and outpatient follow up given.   Discharge Medication List as of 09/30/2015  6:46 PM    START taking these medications   Details  HYDROcodone-acetaminophen (NORCO/VICODIN) 5-325 MG tablet Take 1-2 tablets by mouth every 6 hours as needed for pain., Print    methocarbamol (ROBAXIN) 500 MG tablet Take 1 tablet (500 mg total) by mouth 2 (two) times daily as needed for muscle spasms., Starting 09/30/2015, Until Discontinued, Print             Monico Blitz, PA-C 09/30/15 1638  Merrily Pew, MD 10/01/15 [phone]

## 2015-09-30 NOTE — Discharge Instructions (Signed)
Starting tomorrow: For pain control please take ibuprofen (also known as Motrin or Advil) 856m (this is normally 4 over the counter pills) 3 times a day  for 5 days. Take with food to minimize stomach irritation.  Take robaxin and/or Vicodin for breakthrough pain, do not drink alcohol, drive, care for children or perfom other critical tasks while taking robaxin and/or Vicodin .  Please follow with your primary care doctor in the next 2 days for a check-up. They must obtain records for further management.   Do not hesitate to return to the Emergency Department for any new, worsening or concerning symptoms.     Low Back Strain With Rehab A strain is an injury in which a tendon or muscle is torn. The muscles and tendons of the lower back are vulnerable to strains. However, these muscles and tendons are very strong and require a great force to be injured. Strains are classified into three categories. Grade 1 strains cause pain, but the tendon is not lengthened. Grade 2 strains include a lengthened ligament, due to the ligament being stretched or partially ruptured. With grade 2 strains there is still function, although the function may be decreased. Grade 3 strains involve a complete tear of the tendon or muscle, and function is usually impaired. SYMPTOMS   Pain in the lower back.  Pain that affects one side more than the other.  Pain that gets worse with movement and may be felt in the hip, buttocks, or back of the thigh.  Muscle spasms of the muscles in the back.  Swelling along the muscles of the back.  Loss of strength of the back muscles.  Crackling sound (crepitation) when the muscles are touched. CAUSES  Lower back strains occur when a force is placed on the muscles or tendons that is greater than they can handle. Common causes of injury include:  Prolonged overuse of the muscle-tendon units in the lower back, usually from incorrect posture.  A single violent injury or force  applied to the back. RISK INCREASES WITH:  Sports that involve twisting forces on the spine or a lot of bending at the waist (football, rugby, weightlifting, bowling, golf, tennis, speed skating, racquetball, swimming, running, gymnastics, diving).  Poor strength and flexibility.  Failure to warm up properly before activity.  Family history of lower back pain or disk disorders.  Previous back injury or surgery (especially fusion).  Poor posture with lifting, especially heavy objects.  Prolonged sitting, especially with poor posture. PREVENTION   Learn and use proper posture when sitting or lifting (maintain proper posture when sitting, lift using the knees and legs, not at the waist).  Warm up and stretch properly before activity.  Allow for adequate recovery between workouts.  Maintain physical fitness:  Strength, flexibility, and endurance.  Cardiovascular fitness. PROGNOSIS  If treated properly, lower back strains usually heal within 6 weeks. RELATED COMPLICATIONS   Recurring symptoms, resulting in a chronic problem.  Chronic inflammation, scarring, and partial muscle-tendon tear.  Delayed healing or resolution of symptoms.  Prolonged disability. TREATMENT  Treatment first involves the use of ice and medicine, to reduce pain and inflammation. The use of strengthening and stretching exercises may help reduce pain with activity. These exercises may be performed at home or with a therapist. Severe injuries may require referral to a therapist for further evaluation and treatment, such as ultrasound. Your caregiver may advise that you wear a back brace or corset, to help reduce pain and discomfort. Often, prolonged bed rest  results in greater harm then benefit. Corticosteroid injections may be recommended. However, these should be reserved for the most serious cases. It is important to avoid using your back when lifting objects. At night, sleep on your back on a firm mattress  with a pillow placed under your knees. If non-surgical treatment is unsuccessful, surgery may be needed.  MEDICATION   If pain medicine is needed, nonsteroidal anti-inflammatory medicines (aspirin and ibuprofen), or other minor pain relievers (acetaminophen), are often advised.  Do not take pain medicine for 7 days before surgery.  Prescription pain relievers may be given, if your caregiver thinks they are needed. Use only as directed and only as much as you need.  Ointments applied to the skin may be helpful.  Corticosteroid injections may be given by your caregiver. These injections should be reserved for the most serious cases, because they may only be given a certain number of times. HEAT AND COLD  Cold treatment (icing) should be applied for 10 to 15 minutes every 2 to 3 hours for inflammation and pain, and immediately after activity that aggravates your symptoms. Use ice packs or an ice massage.  Heat treatment may be used before performing stretching and strengthening activities prescribed by your caregiver, physical therapist, or athletic trainer. Use a heat pack or a warm water soak. SEEK MEDICAL CARE IF:   Symptoms get worse or do not improve in 2 to 4 weeks, despite treatment.  You develop numbness, weakness, or loss of bowel or bladder function.  New, unexplained symptoms develop. (Drugs used in treatment may produce side effects.) EXERCISES  RANGE OF MOTION (ROM) AND STRETCHING EXERCISES - Low Back Strain Most people with lower back pain will find that their symptoms get worse with excessive bending forward (flexion) or arching at the lower back (extension). The exercises which will help resolve your symptoms will focus on the opposite motion.  Your physician, physical therapist or athletic trainer will help you determine which exercises will be most helpful to resolve your lower back pain. Do not complete any exercises without first consulting with your caregiver. Discontinue  any exercises which make your symptoms worse until you speak to your caregiver.  If you have pain, numbness or tingling which travels down into your buttocks, leg or foot, the goal of the therapy is for these symptoms to move closer to your back and eventually resolve. Sometimes, these leg symptoms will get better, but your lower back pain may worsen. This is typically an indication of progress in your rehabilitation. Be very alert to any changes in your symptoms and the activities in which you participated in the 24 hours prior to the change. Sharing this information with your caregiver will allow him/her to most efficiently treat your condition.  These exercises may help you when beginning to rehabilitate your injury. Your symptoms may resolve with or without further involvement from your physician, physical therapist or athletic trainer. While completing these exercises, remember:  Restoring tissue flexibility helps normal motion to return to the joints. This allows healthier, less painful movement and activity.  An effective stretch should be held for at least 30 seconds.  A stretch should never be painful. You should only feel a gentle lengthening or release in the stretched tissue. FLEXION RANGE OF MOTION AND STRETCHING EXERCISES: STRETCH - Flexion, Single Knee to Chest   Lie on a firm bed or floor with both legs extended in front of you.  Keeping one leg in contact with the floor, bring your  opposite knee to your chest. Hold your leg in place by either grabbing behind your thigh or at your knee.  Pull until you feel a gentle stretch in your lower back. Hold __________ seconds.  Slowly release your grasp and repeat the exercise with the opposite side. Repeat __________ times. Complete this exercise __________ times per day.  STRETCH - Flexion, Double Knee to Chest   Lie on a firm bed or floor with both legs extended in front of you.  Keeping one leg in contact with the floor, bring  your opposite knee to your chest.  Tense your stomach muscles to support your back and then lift your other knee to your chest. Hold your legs in place by either grabbing behind your thighs or at your knees.  Pull both knees toward your chest until you feel a gentle stretch in your lower back. Hold __________ seconds.  Tense your stomach muscles and slowly return one leg at a time to the floor. Repeat __________ times. Complete this exercise __________ times per day.  STRETCH - Low Trunk Rotation  Lie on a firm bed or floor. Keeping your legs in front of you, bend your knees so they are both pointed toward the ceiling and your feet are flat on the floor.  Extend your arms out to the side. This will stabilize your upper body by keeping your shoulders in contact with the floor.  Gently and slowly drop both knees together to one side until you feel a gentle stretch in your lower back. Hold for __________ seconds.  Tense your stomach muscles to support your lower back as you bring your knees back to the starting position. Repeat the exercise to the other side. Repeat __________ times. Complete this exercise __________ times per day  EXTENSION RANGE OF MOTION AND FLEXIBILITY EXERCISES: STRETCH - Extension, Prone on Elbows   Lie on your stomach on the floor, a bed will be too soft. Place your palms about shoulder width apart and at the height of your head.  Place your elbows under your shoulders. If this is too painful, stack pillows under your chest.  Allow your body to relax so that your hips drop lower and make contact more completely with the floor.  Hold this position for __________ seconds.  Slowly return to lying flat on the floor. Repeat __________ times. Complete this exercise __________ times per day.  RANGE OF MOTION - Extension, Prone Press Ups  Lie on your stomach on the floor, a bed will be too soft. Place your palms about shoulder width apart and at the height of your  head.  Keeping your back as relaxed as possible, slowly straighten your elbows while keeping your hips on the floor. You may adjust the placement of your hands to maximize your comfort. As you gain motion, your hands will come more underneath your shoulders.  Hold this position __________ seconds.  Slowly return to lying flat on the floor. Repeat __________ times. Complete this exercise __________ times per day.  RANGE OF MOTION- Quadruped, Neutral Spine   Assume a hands and knees position on a firm surface. Keep your hands under your shoulders and your knees under your hips. You may place padding under your knees for comfort.  Drop your head and point your tail bone toward the ground below you. This will round out your lower back like an angry cat. Hold this position for __________ seconds.  Slowly lift your head and release your tail bone so that your  back sags into a large arch, like an old horse.  Hold this position for __________ seconds.  Repeat this until you feel limber in your lower back.  Now, find your "sweet spot." This will be the most comfortable position somewhere between the two previous positions. This is your neutral spine. Once you have found this position, tense your stomach muscles to support your lower back.  Hold this position for __________ seconds. Repeat __________ times. Complete this exercise __________ times per day.  STRENGTHENING EXERCISES - Low Back Strain These exercises may help you when beginning to rehabilitate your injury. These exercises should be done near your "sweet spot." This is the neutral, low-back arch, somewhere between fully rounded and fully arched, that is your least painful position. When performed in this safe range of motion, these exercises can be used for people who have either a flexion or extension based injury. These exercises may resolve your symptoms with or without further involvement from your physician, physical therapist or  athletic trainer. While completing these exercises, remember:   Muscles can gain both the endurance and the strength needed for everyday activities through controlled exercises.  Complete these exercises as instructed by your physician, physical therapist or athletic trainer. Increase the resistance and repetitions only as guided.  You may experience muscle soreness or fatigue, but the pain or discomfort you are trying to eliminate should never worsen during these exercises. If this pain does worsen, stop and make certain you are following the directions exactly. If the pain is still present after adjustments, discontinue the exercise until you can discuss the trouble with your caregiver. STRENGTHENING - Deep Abdominals, Pelvic Tilt  Lie on a firm bed or floor. Keeping your legs in front of you, bend your knees so they are both pointed toward the ceiling and your feet are flat on the floor.  Tense your lower abdominal muscles to press your lower back into the floor. This motion will rotate your pelvis so that your tail bone is scooping upwards rather than pointing at your feet or into the floor.  With a gentle tension and even breathing, hold this position for __________ seconds. Repeat __________ times. Complete this exercise __________ times per day.  STRENGTHENING - Abdominals, Crunches   Lie on a firm bed or floor. Keeping your legs in front of you, bend your knees so they are both pointed toward the ceiling and your feet are flat on the floor. Cross your arms over your chest.  Slightly tip your chin down without bending your neck.  Tense your abdominals and slowly lift your trunk high enough to just clear your shoulder blades. Lifting higher can put excessive stress on the lower back and does not further strengthen your abdominal muscles.  Control your return to the starting position. Repeat __________ times. Complete this exercise __________ times per day.  STRENGTHENING - Quadruped,  Opposite UE/LE Lift   Assume a hands and knees position on a firm surface. Keep your hands under your shoulders and your knees under your hips. You may place padding under your knees for comfort.  Find your neutral spine and gently tense your abdominal muscles so that you can maintain this position. Your shoulders and hips should form a rectangle that is parallel with the floor and is not twisted.  Keeping your trunk steady, lift your right hand no higher than your shoulder and then your left leg no higher than your hip. Make sure you are not holding your breath. Hold this  position __________ seconds.  Continuing to keep your abdominal muscles tense and your back steady, slowly return to your starting position. Repeat with the opposite arm and leg. Repeat __________ times. Complete this exercise __________ times per day.  STRENGTHENING - Lower Abdominals, Double Knee Lift  Lie on a firm bed or floor. Keeping your legs in front of you, bend your knees so they are both pointed toward the ceiling and your feet are flat on the floor.  Tense your abdominal muscles to brace your lower back and slowly lift both of your knees until they come over your hips. Be certain not to hold your breath.  Hold __________ seconds. Using your abdominal muscles, return to the starting position in a slow and controlled manner. Repeat __________ times. Complete this exercise __________ times per day.  POSTURE AND BODY MECHANICS CONSIDERATIONS - Low Back Strain Keeping correct posture when sitting, standing or completing your activities will reduce the stress put on different body tissues, allowing injured tissues a chance to heal and limiting painful experiences. The following are general guidelines for improved posture. Your physician or physical therapist will provide you with any instructions specific to your needs. While reading these guidelines, remember:  The exercises prescribed by your provider will help you  have the flexibility and strength to maintain correct postures.  The correct posture provides the best environment for your joints to work. All of your joints have less wear and tear when properly supported by a spine with good posture. This means you will experience a healthier, less painful body.  Correct posture must be practiced with all of your activities, especially prolonged sitting and standing. Correct posture is as important when doing repetitive low-stress activities (typing) as it is when doing a single heavy-load activity (lifting). RESTING POSITIONS Consider which positions are most painful for you when choosing a resting position. If you have pain with flexion-based activities (sitting, bending, stooping, squatting), choose a position that allows you to rest in a less flexed posture. You would want to avoid curling into a fetal position on your side. If your pain worsens with extension-based activities (prolonged standing, working overhead), avoid resting in an extended position such as sleeping on your stomach. Most people will find more comfort when they rest with their spine in a more neutral position, neither too rounded nor too arched. Lying on a non-sagging bed on your side with a pillow between your knees, or on your back with a pillow under your knees will often provide some relief. Keep in mind, being in any one position for a prolonged period of time, no matter how correct your posture, can still lead to stiffness. PROPER SITTING POSTURE In order to minimize stress and discomfort on your spine, you must sit with correct posture. Sitting with good posture should be effortless for a healthy body. Returning to good posture is a gradual process. Many people can work toward this most comfortably by using various supports until they have the flexibility and strength to maintain this posture on their own. When sitting with proper posture, your ears will fall over your shoulders and your  shoulders will fall over your hips. You should use the back of the chair to support your upper back. Your lower back will be in a neutral position, just slightly arched. You may place a small pillow or folded towel at the base of your lower back for support.  When working at a desk, create an environment that supports good, upright posture. Without  extra support, muscles tire, which leads to excessive strain on joints and other tissues. Keep these recommendations in mind: CHAIR:  A chair should be able to slide under your desk when your back makes contact with the back of the chair. This allows you to work closely.  The chair's height should allow your eyes to be level with the upper part of your monitor and your hands to be slightly lower than your elbows. BODY POSITION  Your feet should make contact with the floor. If this is not possible, use a foot rest.  Keep your ears over your shoulders. This will reduce stress on your neck and lower back. INCORRECT SITTING POSTURES  If you are feeling tired and unable to assume a healthy sitting posture, do not slouch or slump. This puts excessive strain on your back tissues, causing more damage and pain. Healthier options include:  Using more support, like a lumbar pillow.  Switching tasks to something that requires you to be upright or walking.  Talking a brief walk.  Lying down to rest in a neutral-spine position. PROLONGED STANDING WHILE SLIGHTLY LEANING FORWARD  When completing a task that requires you to lean forward while standing in one place for a long time, place either foot up on a stationary 2-4 inch high object to help maintain the best posture. When both feet are on the ground, the lower back tends to lose its slight inward curve. If this curve flattens (or becomes too large), then the back and your other joints will experience too much stress, tire more quickly, and can cause pain. CORRECT STANDING POSTURES Proper standing posture  should be assumed with all daily activities, even if they only take a few moments, like when brushing your teeth. As in sitting, your ears should fall over your shoulders and your shoulders should fall over your hips. You should keep a slight tension in your abdominal muscles to brace your spine. Your tailbone should point down to the ground, not behind your body, resulting in an over-extended swayback posture.  INCORRECT STANDING POSTURES  Common incorrect standing postures include a forward head, locked knees and/or an excessive swayback. WALKING Walk with an upright posture. Your ears, shoulders and hips should all line-up. PROLONGED ACTIVITY IN A FLEXED POSITION When completing a task that requires you to bend forward at your waist or lean over a low surface, try to find a way to stabilize 3 out of 4 of your limbs. You can place a hand or elbow on your thigh or rest a knee on the surface you are reaching across. This will provide you more stability so that your muscles do not fatigue as quickly. By keeping your knees relaxed, or slightly bent, you will also reduce stress across your lower back. CORRECT LIFTING TECHNIQUES DO :   Assume a wide stance. This will provide you more stability and the opportunity to get as close as possible to the object which you are lifting.  Tense your abdominals to brace your spine. Bend at the knees and hips. Keeping your back locked in a neutral-spine position, lift using your leg muscles. Lift with your legs, keeping your back straight.  Test the weight of unknown objects before attempting to lift them.  Try to keep your elbows locked down at your sides in order get the best strength from your shoulders when carrying an object.  Always ask for help when lifting heavy or awkward objects. INCORRECT LIFTING TECHNIQUES DO NOT:   Lock your knees  when lifting, even if it is a small object.  Bend and twist. Pivot at your feet or move your feet when needing to  change directions.  Assume that you can safely pick up even a paper clip without proper posture.   This information is not intended to replace advice given to you by your health care provider. Make sure you discuss any questions you have with your health care provider.   Document Released: 03/08/2005 Document Revised: 03/29/2014 Document Reviewed: 06/20/2008 [employer] Interactive Patient Education [employer].

## 2015-10-09 ENCOUNTER — Ambulatory Visit (INDEPENDENT_AMBULATORY_CARE_PROVIDER_SITE_OTHER): Payer: 59 | Admitting: Family Medicine

## 2015-10-09 ENCOUNTER — Encounter: Payer: Self-pay | Admitting: Family Medicine

## 2015-10-09 VITALS — BP 128/88 | HR 97 | Temp 98.4°F | Ht 72.0 in | Wt 201.0 lb

## 2015-10-09 DIAGNOSIS — M545 Low back pain, unspecified: Secondary | ICD-10-CM

## 2015-10-09 NOTE — Progress Notes (Signed)
Pre visit review using our clinic review tool, if applicable. No additional management support is needed unless otherwise documented below in the visit note. 

## 2015-10-09 NOTE — Progress Notes (Signed)
Subjective:  Daryl Pace is a 40 y.o. year old very pleasant male patient who presents for/with See problem oriented charting  ROS-No saddle anesthesia, bladder incontinence, fecal incontinence, weakness in extremity, numbness or tingling in extremity. History negative for trauma, history of cancer, fever, chills, unintentional weight loss, recent bacterial infection.    Past Medical History-  Patient Active Problem List   Diagnosis Date Noted  . Current smoker 11/20/2014    Priority: High  . Hyperlipidemia 03/06/2015    Priority: Medium  . Hypertension     Priority: Medium  . Atypical chest pain 07/10/2015    Medications- reviewed and updated Current Outpatient Prescriptions  Medication Sig Dispense Refill  . HYDROcodone-acetaminophen (NORCO/VICODIN) 5-325 MG tablet Take 1-2 tablets by mouth every 6 hours as needed for pain. 7 tablet 0  . methocarbamol (ROBAXIN) 500 MG tablet Take 1 tablet (500 mg total) by mouth 2 (two) times daily as needed for muscle spasms. 20 tablet 0  . nicotine polacrilex ([employer] NICOTINE POLACRILEX) 2 MG gum Take 1 each (2 mg total) by mouth as needed for smoking cessation (5 max per day). 100 tablet 0   No current facility-administered medications for this visit.    Objective: BP 128/88 mmHg  Pulse 97  Temp(Src) 98.4 F (36.9 C) (Oral)  Ht 6' (1.829 m)  Wt 201 lb (91.173 kg)  BMI 27.25 kg/m2  SpO2 99% Gen: NAD, resting comfortably CV: RRR no murmurs rubs or gallops Lungs: CTAB no crackles, wheeze, rhonchi Abdomen: soft/nontender/nondistended/normal bowel sounds. No rebound or guarding.  Ext: no edema Skin: warm, dry Back - Normal skin, Spine with normal alignment and no deformity.  No tenderness to vertebral process palpation.  Paraspinous muscles are tender mainly on left and one 2 x 2 are of muscle spasm noted.   Range of motion is full at neck and lumbar sacral regions. Negative Straight leg raise.  Neuro- no saddle anesthesia, 5/5 strength  lower extremities, 2+ reflexes  Assessment/Plan:  Low Back pain S: 9 days ago seen in ED for low back pain bilatearally without sciatica, pain had been ongoing for a week. Started after leaning forward to put sock on. No bladder or bowel habit changes or leg weakness or numbness/tinging. Given percocet and robaxin. Has finished percocet and only doing ibuprofen. Never used robaxin. 10/10 initial now 4/10 aching.  A/P: no red flags once again. Pain improving- plan to return to work on Monday- rest up a few more days. Needs core strengthening- advised to return to gym. Offered PT but wants to do on own for now. Work note written- he will be doing short term disability paperwork as well for being out 09/30/15 to 10/13/15.    As always advised quit smoking  The duration of face-to-face time during this visit was 25 minutes. Greater than 50% of this time was spent in counseling, explanation of diagnosis, planning of further management, and/or coordination of care.     Return precautions advised.  [name], MD

## 2015-10-09 NOTE — Patient Instructions (Signed)
Plan to start back work on Monday- if you are having increasing pain above 4/10 please let me know- may need to reevaluate

## 2016-08-31 ENCOUNTER — Encounter: Payer: Self-pay | Admitting: Family Medicine

## 2016-08-31 ENCOUNTER — Ambulatory Visit (INDEPENDENT_AMBULATORY_CARE_PROVIDER_SITE_OTHER): Payer: 59 | Admitting: Family Medicine

## 2016-08-31 VITALS — BP 118/86 | HR 96 | Temp 98.2°F | Ht 72.0 in | Wt 209.0 lb

## 2016-08-31 DIAGNOSIS — N6310 Unspecified lump in the right breast, unspecified quadrant: Secondary | ICD-10-CM

## 2016-08-31 NOTE — Patient Instructions (Signed)
Suspect this is non cancerous/benign enlargement of male chest tissue  Will get ultrasound to be sure. We will call you within a week or two about your referral to breast center for ultrasound (they may change this to mammogram). If you do not hear within 3 weeks, give [place] a call.

## 2016-08-31 NOTE — Progress Notes (Signed)
Subjective:  Daryl Pace is a 41 y.o. year old very pleasant male patient who presents for/with See problem oriented charting ROS- No chest pain (other than around nipple) or shortness of breath. No headache or blurry vision.     Past Medical History-  Patient Active Problem List   Diagnosis Date Noted  . Current smoker 11/20/2014    Priority: High  . Hyperlipidemia 03/06/2015    Priority: Medium  . Hypertension     Priority: Medium  . Atypical chest pain 07/10/2015    Medications- reviewed and updated, none  Objective: BP 118/86   Pulse 96   Temp 98.2 F (36.8 C) (Oral)   Ht 6' (1.829 m)   Wt 209 lb (94.8 kg)   SpO2 99%   BMI 28.35 kg/m  Gen: NAD, resting comfortably CV: RRR no murmurs rubs or gallops Lungs: CTAB no crackles, wheeze, rhonchi Chest: mild enlargement of tissue behind nipple on left. More prominent, tense on behind right nipple about 5 x 5 cm. Tender to manipulation. No clear fluctuant area Ext: no edema Skin: warm, dry  Assessment/Plan:  Lump of right breast - Plan: US BREAST LTD UNI RIGHT INC AXILLA S: right breast lump noted about 1.5 weeks ago. Had some pain in his right chest when he woke up in right upper mid chest- felt a very small lump/knot. Was warm at first and had pain- now has moved to a pain below his nipple- any kind of sensation bothers it- touched, rubbed, motion like jumping.3/10 at its worst- aching sensation. Ibuprofen before bed one night- not sure if it helped.    A week prior had some eczema on right neck he believes and that has improved- not clearly connected.  A/P: right breast lump- suspected gynecomastia. mild to moderate pain. Family history of breast cancer in 2 aunts but in 71s. Will get ultrasound. Ok for this to change to mammogram at breast centers discretion  Orders Placed This Encounter  Procedures  . US BREAST LTD UNI RIGHT INC AXILLA    Standing Status:   Future    Standing Expiration Date:   10/31/2017   Order Specific Question:   Reason for Exam (SYMPTOM  OR DIAGNOSIS REQUIRED)    Answer:   suspected gynecomastia. ok to change to mammogram if radiology prefers or to add mammogram. mild to moderate pain. Family history of breast cancer in 2 aunts but in 48s    Order Specific Question:   Preferred imaging location?    Answer:   [facility]   Return precautions advised for new or worsening symptoms.  [name], MD

## 2016-11-26 ENCOUNTER — Other Ambulatory Visit: Payer: Self-pay | Admitting: Family Medicine

## 2016-11-26 DIAGNOSIS — N6310 Unspecified lump in the right breast, unspecified quadrant: Secondary | ICD-10-CM

## 2017-09-06 ENCOUNTER — Encounter (HOSPITAL_COMMUNITY): Payer: Self-pay

## 2017-09-06 ENCOUNTER — Emergency Department (HOSPITAL_COMMUNITY)
Admission: EM | Admit: 2017-09-06 | Discharge: 2017-09-06 | Disposition: A | Discharge status: Home / Self Care | Payer: 59 | Attending: Emergency Medicine | Admitting: Emergency Medicine

## 2017-09-06 ENCOUNTER — Emergency Department (HOSPITAL_COMMUNITY): Payer: 59

## 2017-09-06 ENCOUNTER — Other Ambulatory Visit: Payer: Self-pay

## 2017-09-06 DIAGNOSIS — F17210 Nicotine dependence, cigarettes, uncomplicated: Secondary | ICD-10-CM | POA: Insufficient documentation

## 2017-09-06 DIAGNOSIS — M546 Pain in thoracic spine: Secondary | ICD-10-CM

## 2017-09-06 DIAGNOSIS — I10 Essential (primary) hypertension: Secondary | ICD-10-CM | POA: Diagnosis not present

## 2017-09-06 DIAGNOSIS — R0789 Other chest pain: Secondary | ICD-10-CM | POA: Diagnosis not present

## 2017-09-06 LAB — CBC
HEMATOCRIT: 47.8 % (ref 39.0–52.0)
Hemoglobin: 16.1 g/dL (ref 13.0–17.0)
MCH: 29.2 pg (ref 26.0–34.0)
MCHC: 33.7 g/dL (ref 30.0–36.0)
MCV: 86.6 fL (ref 78.0–100.0)
Platelets: 332 10*3/uL (ref 150–400)
RBC: 5.52 MIL/uL (ref 4.22–5.81)
RDW: 14.2 % (ref 11.5–15.5)
WBC: 5.8 10*3/uL (ref 4.0–10.5)

## 2017-09-06 LAB — BASIC METABOLIC PANEL
Anion gap: 10 (ref 5–15)
BUN: 14 mg/dL (ref 6–20)
CHLORIDE: 104 mmol/L (ref 101–111)
CO2: 27 mmol/L (ref 22–32)
Calcium: 9.5 mg/dL (ref 8.9–10.3)
Creatinine, Ser: 1.09 mg/dL (ref 0.61–1.24)
GFR calc non Af Amer: >60 mL/min (ref >60)
Glucose, Bld: 104 mg/dL — ABNORMAL HIGH (ref 65–99)
POTASSIUM: 4.5 mmol/L (ref 3.5–5.1)
SODIUM: 141 mmol/L (ref 135–145)

## 2017-09-06 LAB — I-STAT TROPONIN, ED
TROPONIN I, POC: 0 ng/mL (ref 0.00–0.08)
Troponin i, poc: 0 ng/mL (ref 0.00–0.08)

## 2017-09-06 MED ORDER — LIDOCAINE 5 % EX PTCH: 1.0000 | MEDICATED_PATCH | CUTANEOUS | 0 refills | Status: DC

## 2017-09-06 MED ORDER — KETOROLAC TROMETHAMINE 30 MG/ML IJ SOLN
30.0000 mg | Freq: Once | INTRAMUSCULAR | Status: AC
  Administered 2017-09-06: 30 mg via INTRAVENOUS

## 2017-09-06 MED ORDER — METHOCARBAMOL 500 MG PO TABS
1000.0000 mg | ORAL_TABLET | Freq: Once | ORAL | Status: AC
  Administered 2017-09-06: 1000 mg via ORAL
  Filled 2017-09-06: qty 2

## 2017-09-06 MED ORDER — METHOCARBAMOL 500 MG PO TABS: 500.0000 mg | ORAL_TABLET | Freq: Two times a day (BID) | ORAL | 0 refills | Status: DC

## 2017-09-06 MED ORDER — KETOROLAC TROMETHAMINE 60 MG/2ML IM SOLN
60.0000 mg | Freq: Once | INTRAMUSCULAR | Status: DC
  Filled 2017-09-06: qty 2

## 2017-09-06 NOTE — ED Provider Notes (Signed)
[facility] DEPT Provider Note   CSN: [id number] Arrival date & time: 09/06/17  1418     History   Chief Complaint Chief Complaint  Patient presents with  . Back Pain  . Arm Pain  . Chest Pain    HPI Daryl Pace is a 42 y.o. male.  HPI   Daryl Pace is a 42 y.o. male, with a history of HTN, presenting to the ED with onset of back pain earlier this morning.  States he began to feel a "knot like a golf ball" in the left upper back.  Pain described as a tightness, and when he tried to range his shoulder, he began to have spasming like pain in the left trapezius and around to the left chest.  Sensation lasted for several hours, but has spontaneously improved.  Discomfort in the chest spontaneously resolved.  Rates the pain at onset at 8/10, is now minor unless he uses his muscles in the back.  Denies SOB, cough, N/V/D, diaphoresis, dizziness, fever/chills, abdominal pain, leg swelling or pain, palpitations, or any other complaints.  Patient does have a history of patient hypertension, but has resisted recommendations for medication.  Past Medical History:  Diagnosis Date  . Chicken pox   . Hypertension    diagnosed at work screenings  . Kidney stone    around age 34, peed them out    Patient Active Problem List   Diagnosis Date Noted  . Atypical chest pain 07/10/2015  . Hyperlipidemia 03/06/2015  . Current smoker 11/20/2014  . Hypertension     Past Surgical History:  Procedure Laterality Date  . HERNIA REPAIR     around 25, bilateral        Home Medications    Prior to Admission medications   Medication Sig Start Date End Date Taking? Authorizing Provider  ibuprofen (ADVIL,MOTRIN) 200 MG tablet Take 800 mg by mouth daily as needed for moderate pain.   Yes [provider]  lidocaine (LIDODERM) 5 % Place 1 patch onto the skin daily. Remove & Discard patch within 12 hours or as directed by MD 09/06/17   Britteney Ayotte,  Brailyn Killion C, PA-C  methocarbamol (ROBAXIN) 500 MG tablet Take 1 tablet (500 mg total) by mouth 2 (two) times daily. 09/06/17   Carmichael Burdette, Helane Gunther, PA-C    Family History Family History  Problem Relation Age of Onset  . Stroke Mother        early 77s, drop foot  . Hypertension Mother   . Breast cancer Maternal Aunt     Social History Social History   Tobacco Use  . Smoking status: Current Every Day Smoker    Packs/day: 0.60    Years: 13.00    Pack years: 7.80    Types: Cigarettes  . Smokeless tobacco: Never Used  Substance Use Topics  . Alcohol use: Yes    Alcohol/week: 3.0 - 6.0 oz    Types: 5 - 10 Standard drinks or equivalent per week  . Drug use: No     Allergies   Patient has no known allergies.   Review of Systems Review of Systems  Constitutional: Negative for chills, diaphoresis and fever.  Respiratory: Negative for cough and shortness of breath.   Cardiovascular: Negative for palpitations and leg swelling.  Gastrointestinal: Negative for abdominal pain, diarrhea, nausea and vomiting.  Musculoskeletal: Positive for back pain.  Neurological: Negative for dizziness, weakness and numbness.  All other systems reviewed and are negative.  Physical Exam Updated Vital Signs BP (!) 167/111 (BP Location: Right Arm)   Pulse (!) 102   Temp 97.7 F (36.5 C) (Oral)   Resp 18   Ht 6' (1.829 m)   Wt 93 kg (205 lb)   SpO2 100%   BMI 27.80 kg/m   Physical Exam  Constitutional: He appears well-developed and well-nourished. No distress.  HENT:  Head: Normocephalic and atraumatic.  Eyes: Conjunctivae are normal.  Neck: Normal range of motion. Neck supple.  Cardiovascular: Normal rate, regular rhythm, normal heart sounds and intact distal pulses.  Pulse 78 during my exam.  Pulmonary/Chest: Effort normal and breath sounds normal. No respiratory distress.  No increased work of breathing.  Speaks in full sentences without difficulty.  Abdominal: Soft. There is no  tenderness. There is no guarding.  Musculoskeletal: He exhibits tenderness. He exhibits no edema.       Back:  Tenderness to the left upper back in the areas indicated.  Tenderness follows the large muscles in the upper back.  Pain is worse with palpation, but patient states the area "loosens" and pain improves when the muscles in question are stretched. Full range of motion through the cardinal directions of the left shoulder.  Lymphadenopathy:    He has no cervical adenopathy.  Neurological: He is alert.  Sensation grossly intact to light touch in the left upper extremity. Grip strength equal. Strength 5/5 in the bilateral upper extremities.  Skin: Skin is warm and dry. He is not diaphoretic.  Psychiatric: He has a normal mood and affect. His behavior is normal.  Nursing note and vitals reviewed.    ED Treatments / Results  Labs (all labs ordered are listed, but only abnormal results are displayed) Labs Reviewed  BASIC METABOLIC PANEL - Abnormal; Notable for the following components:      Result Value   Glucose, Bld 104 (*)    All other components within normal limits  CBC  I-STAT TROPONIN, ED  I-STAT TROPONIN, ED    EKG EKG Interpretation  Date/Time:  Tuesday September 06 2017 14:25:23 EDT Ventricular Rate:  83 PR Interval:    QRS Duration: 82 QT Interval:  332 QTC Calculation: 390 R Axis:   63 Text Interpretation:  Sinus rhythm Probable left atrial enlargement ST elev, probable normal early repol pattern Confirmed by Veryl Speak [phone]) on 09/07/2017 12:15:25 AM   Radiology Dg Chest 2 View  Result Date: 09/06/2017 CLINICAL DATA:  42 year old male with left chest and shoulder pain onset today. Similar prior episode 18 months ago. Smoker. EXAM: CHEST - 2 VIEW COMPARISON:  06/29/2015 chest radiographs. FINDINGS: Lung volumes and mediastinal contours remain normal. Visualized tracheal air column is within normal limits. The lung parenchyma appears stable and clear. No  pneumothorax or pleural effusion. Stable and negative visible osseous structures. Paucity of bowel gas in the upper abdomen. IMPRESSION: Stable and negative.  No acute cardiopulmonary abnormality. Electronically Signed   By: Genevie Ann M.D.   On: 09/06/2017 14:51    Procedures Procedures (including critical care time)  Medications Ordered in ED Medications  methocarbamol (ROBAXIN) tablet 1,000 mg (1,000 mg Oral Given 09/06/17 1618)  ketorolac (TORADOL) 30 MG/ML injection 30 mg (30 mg Intravenous Given 09/06/17 1617)     Initial Impression / Assessment and Plan / ED Course  I have reviewed the triage vital signs and the nursing notes.  Pertinent labs & imaging results that were available during my care of the patient were reviewed by me and considered  in my medical decision making (see chart for details).  Clinical Course as of Sep 07 13  Tue Sep 06, 2017  1715 Patient reevaluated.  Voices significant improvement in back pain and muscle tightness.   [SJ]    Clinical Course User Index [SJ] Torah Pinnock C, PA-C    Patient presents with left upper back pain.  Presentation consistent with possible muscle spasm.  Pain reproducible on exam and improves with stretching of the muscle.  Pain further improved with Robaxin and Toradol. Low suspicion for ACS. HEART score is 1, indicating low risk for a cardiac event. PERC negative.  PCP follow-up. The patient was given instructions for home care as well as return precautions. Patient voices understanding of these instructions, accepts the plan, and is comfortable with discharge.   Vitals:   09/06/17 1745 09/06/17 1800 09/06/17 1815 09/06/17 1945  BP:  125/82  (!) 130/97  Pulse: 68 65 76 66  Resp: (!) 22 (!) 21 (!) 21 (!) 21  Temp:      TempSrc:      SpO2: 99% 99% 98% 98%  Weight:      Height:          Final Clinical Impressions(s) / ED Diagnoses   Final diagnoses:  Acute left-sided thoracic back pain    ED Discharge Orders         Ordered    methocarbamol (ROBAXIN) 500 MG tablet  2 times daily     09/06/17 1926    lidocaine (LIDODERM) 5 %  Every 24 hours     09/06/17 1926       Layla Maw 09/07/17 0017    Valarie Merino, MD 09/09/17 1151

## 2017-09-06 NOTE — ED Triage Notes (Signed)
Patient c/o having a "bubble" under his left shoulder blade, left chest and left arm pain since last night. Patient also c/o slight SOB, but denies diaphoresis and nausea.

## 2017-09-06 NOTE — Discharge Instructions (Addendum)
Lab and imaging results were reassuring.  Suspect a muscular source for your pain.  Take it easy, but do not lay around too much as this may make any stiffness worse.  Antiinflammatory medications: Take 600 mg of ibuprofen every 6 hours or 440 mg (over the counter dose) to 500 mg (prescription dose) of naproxen every 12 hours for the next 3 days. After this time, these medications may be used as needed for pain. Take these medications with food to avoid upset stomach. Choose only one of these medications, do not take them together.  Tylenol: Should you continue to have additional pain while taking the ibuprofen or naproxen, you may add in tylenol as needed. Your daily total maximum amount of tylenol from all sources should be limited to 4077m/day for persons without liver problems, or 20068mday for those with liver problems. Muscle relaxer: Robaxin is a muscle relaxer and may help loosen stiff muscles. Do not take the Robaxin while driving or performing other dangerous activities.  Lidocaine patches: These are available via either prescription or over-the-counter. The over-the-counter option may be more economical one and are likely just as effective. There are multiple over-the-counter brands, such as Salonpas. Exercises: Be sure to perform the attached exercises starting with three times a week and working up to performing them daily. This is an essential part of preventing long term problems.   Follow up with a primary care provider for any future management of these complaints.

## 2017-09-12 ENCOUNTER — Ambulatory Visit: Payer: Self-pay

## 2017-09-12 NOTE — Telephone Encounter (Signed)
Patient called in with c/o "dizziness." He says "I have been having dizziness off and on since 09/06/17 when I went to the hospital for chest pain and back pain. I don't know if it's all related, but I was also told my wisdom tooth is infected and sitting on a nerve. So when the back hurts and the tooth hurts, I start feeling lightheaded and butterflies all through my inside. I have to sit down and relax, then I start feeling better. Right now, I am fine, but earlier today it happened. I think I am getting anxious, I really don't know. My BP was also up in the emergency room, but I am not on medication." I asked about other symptoms, he denies. Appointment scheduled for Thursday, 09/15/17 at 1000 with Dr. Yong Channel, care advice given, patient verbalized understanding.  Reason for Disposition . [1] MILD dizziness (e.g., walking normally) AND [2] has NOT been evaluated by physician for this  (Exception: dizziness caused by heat exposure, sudden standing, or poor fluid intake)  Answer Assessment - Initial Assessment Questions 1. DESCRIPTION: "Describe your dizziness."     Lightheaded 2. LIGHTHEADED: "Do you feel lightheaded?" (e.g., somewhat faint, woozy, weak upon standing)     Yes 3. VERTIGO: "Do you feel like either you or the room is spinning or tilting?" (i.e. vertigo)     No 4. SEVERITY: "How bad is it?"  "Do you feel like you are going to faint?" "Can you stand and walk?"   - MILD - walking normally   - MODERATE - interferes with normal activities (e.g., work, school)    - SEVERE - unable to stand, requires support to walk, feels like passing out now.      Mild 5. ONSET:  "When did the dizziness begin?"     09/06/17 off and on 6. AGGRAVATING FACTORS: "Does anything make it worse?" (e.g., standing, change in head position)     I have no idea 7. HEART RATE: "Can you tell me your heart rate?" "How many beats in 15 seconds?"  (Note: not all patients can do this)       No 8. CAUSE: "What do you think  is causing the dizziness?"     I think it's anxiety, unless it's the pain in my tooth or my back 9. RECURRENT SYMPTOM: "Have you had dizziness before?" If so, ask: "When was the last time?" "What happened that time?"     Today 10. OTHER SYMPTOMS: "Do you have any other symptoms?" (e.g., fever, chest pain, vomiting, diarrhea, bleeding)       No 11. PREGNANCY: "Is there any chance you are pregnant?" "When was your last menstrual period?"       N/A  Protocols used: DIZZINESS [facility]

## 2017-09-15 ENCOUNTER — Ambulatory Visit: Payer: 59 | Admitting: Family Medicine

## 2017-09-15 ENCOUNTER — Telehealth: Payer: Self-pay | Admitting: Family Medicine

## 2017-09-15 ENCOUNTER — Encounter: Payer: Self-pay | Admitting: Family Medicine

## 2017-09-15 VITALS — BP 142/84 | HR 68 | Temp 98.3°F | Ht 72.0 in | Wt 196.8 lb

## 2017-09-15 DIAGNOSIS — F419 Anxiety disorder, unspecified: Secondary | ICD-10-CM

## 2017-09-15 DIAGNOSIS — R42 Dizziness and giddiness: Secondary | ICD-10-CM | POA: Diagnosis not present

## 2017-09-15 DIAGNOSIS — I10 Essential (primary) hypertension: Secondary | ICD-10-CM | POA: Diagnosis not present

## 2017-09-15 LAB — MICROALBUMIN / CREATININE URINE RATIO
Creatinine,U: 168.1 mg/dL
Microalb Creat Ratio: 1.7 mg/g (ref 0.0–30.0)
Microalb, Ur: 2.8 mg/dL — ABNORMAL HIGH (ref 0.0–1.9)

## 2017-09-15 MED ORDER — BUSPIRONE HCL 7.5 MG PO TABS: 7.5000 mg | ORAL_TABLET | Freq: Three times a day (TID) | ORAL | 0 refills | Status: DC

## 2017-09-15 MED ORDER — LISINOPRIL 10 MG PO TABS
10.0000 mg | ORAL_TABLET | Freq: Every day | ORAL | 0 refills | Status: DC
Start: 2017-09-15 — End: 2017-11-04

## 2017-09-15 NOTE — Telephone Encounter (Signed)
Pt given results per notes of Dr. Rogers Blocker on 09/15/17, patient verbalized understanding. Unable to document in result note due to result note not being routed to [facility]. Patient asked about having short-term disability forms filled out by Dr. Yong Channel, advised to pick up the forms from his employer and bring them by the office, he verbalized understanding.

## 2017-09-15 NOTE — Progress Notes (Addendum)
Patient: Daryl Pace MRN: [id number] DOB: 01-Mar-1976 PCP: Marin Olp, MD     Subjective:  Chief Complaint  Patient presents with  . Dizziness    x2 months-progressively getting worse    HPI: The patient is a 42 y.o. male who presents today for dizziness that started awhile ago. He first noticed it about 3 years ago. It is intermittent and states it would be 2-3 occasions for about one month and then it went away. He thinks it may have happened again after this, but nothing he took note of. He started having dizzy episodes again 3 months ago. He has been getting episodes daily and last for about 3-4 minutes or less. He gets butterflies in his stomach at the same time and this lasts about 45 minutes. The dizziness doesn't last long. Described as he is spinning and everything else is standing still. He states if he sits still the dizziness is better. Hasn't noticed that the head movement makes it worse. Mainly has when he is walking. He has tinnitus, but this is chronic and not new. He denies any acute hearing loss, drainage or pain in ear. He denies any vision changes, chest pain or palpitations with these episodes.   Butterflies in stomach: this is not triggered by the dizziness. This has been going on for about one month. Seems worse with driving and has had to pull over when this happens. He has associated impending doom with these episodes. He gets weak and just needs to stop. These episodes last about 45 minutes. Starting to happen daily and is impairing life.   Just seen in ER for back pain. Cbc, bmp and troponin negative.   Hypertension:  Has had multiple elevated readings. Needs to be started on medication and could be contributing to above symptoms.   ROS:  Negative for fever/chills Negative for ear pain/hearing loss. +tinnitus. Negative for congestion, rhinorrhea, sinus pain or pressure. Negative for vision changes/loss/photophobia Negative for shortness of  breath/cough Negative for chest pain/palpitations/leg swelling Negative for abdominal pain, diarrhea/conspitation/blood in stool.  +dizziness. Negative headaches, negative weakness, negative focal deficits.  +anxiety. Negative depression. Negative si/hi/ah/vh   Allergies Patient has No Known Allergies.  Past Medical History Patient  has a past medical history of Chicken pox, Hypertension, and Kidney stone.  Surgical History Patient  has a past surgical history that includes Hernia repair.  Family History Pateint's family history includes Breast cancer in his maternal aunt; Hypertension in his mother; Stroke in his mother.  Social History Patient  reports that he has been smoking cigarettes.  He has a 7.80 pack-year smoking history. He has never used smokeless tobacco. He reports that he drinks about 3.0 - 6.0 oz of alcohol per week. He reports that he does not use drugs.    Objective: Vitals:   09/15/17 1133  BP: (!) 142/84  Pulse: 68  Temp: 98.3 F (36.8 C)  TempSrc: Oral  SpO2: 96%  Weight: 196 lb 12.8 oz (89.3 kg)  Height: 6' (1.829 m)    Body mass index is 26.69 kg/m.  Physical Exam  Constitutional: He is oriented to person, place, and time. He appears well-developed and well-nourished.  HENT:  Head: Normocephalic and atraumatic.  Right Ear: External ear normal.  Left Ear: External ear normal.  Mouth/Throat: Oropharynx is clear and moist. No oropharyngeal exudate.  TM pearly with light reflex bilaterally   Eyes: Pupils are equal, round, and reactive to light. EOM are normal.  Neck: Normal range of  motion. Neck supple. No thyromegaly present.  Cardiovascular: Normal rate, regular rhythm and normal heart sounds.  Pulmonary/Chest: Effort normal and breath sounds normal.  Abdominal: Soft. Bowel sounds are normal.  Lymphadenopathy:    He has no cervical adenopathy.  Neurological: He is alert and oriented to person, place, and time. He displays normal reflexes. No  cranial nerve deficit. Coordination normal.  dix halpike negative bilaterally       ekg: done in ER a few days ago.  GAD 7 : Generalized Anxiety Score 09/15/2017  Nervous, Anxious, on Edge 3  Control/stop worrying 3  Worry too much - different things 3  Trouble relaxing 2  Restless 1  Easily annoyed or irritable 2  Afraid - awful might happen 3  Total GAD 7 Score 17  Anxiety Difficulty Very difficult    Assessment/plan: 1. Essential hypertension Blood pressure is not to goal. Baseline labs and ekg done in er yesterday. Will check urine protien today and needs to have cholesterol checked as well. Can have pcp do this at f/u in one month. We are going to start him on low dose lisinopril at 77m. Side effects of medication discussed including dry cough and angioedema. ER precautions given for angioedema. Starting low dose as I do not want to drop him too quickly. Will f/u with pcp in one month for recheck/adjustment and labs. Recommended routine exercise and healthy diet including DASH diet and mediterranean diet. Long term complications of uncontrolled HTN discussed including afib, chf, stroke, MI, CKD to name a few.  - Microalbumin / creatinine urine ratio  2. Dizziness Could be from HTN or anxiety or other etiology. Orthostatics vitals wnl. Exam and history not consistent with BPPV. Treating both anxiety and HTN. If not better at f/u he can discuss further treatment/work up with Dr. HYong Channel   3. Anxiety We are going to start buspar daily. His GAD7 score is quite severe. He is not really onboard with a daily medication (SSRI) and feels more comfortable doing an as needed medication even though I feel like his anxiety warrants a SSRI. We will start him out at 7.557mbid-tid. Side effects discussed. Exercise encouraged as well as yoga. He may benefit from counseling as well. No panic attacks currently. F/u with Dr. HuYong Channeln one month.    Return in about 1 month (around 10/15/2017) for  anxiety/blood pressure .    AlOrma FlamingMD LeDawson 09/15/2017

## 2017-09-16 NOTE — Telephone Encounter (Signed)
See note

## 2017-09-23 ENCOUNTER — Telehealth: Payer: Self-pay | Admitting: Family Medicine

## 2017-09-23 NOTE — Telephone Encounter (Signed)
See note.   Copied from [place] [id number]. Topic: Quick Communication - See Telephone Encounter >> Sep 23, 2017  3:45 PM Antonieta Iba C wrote: CRM for notification. See Telephone encounter for: 09/23/17.  Pt is requesting a call back. Pt says that he has been out of work since 09/09/17 since starting medication. Pt would like to know if he is clear to return to work or if he should stay out until his apt with PCP on 10/13/17? I asked if provider officially took him out of work pt says that he's not sure but didn't feel comfortable working taking medications that were prescribed.   CB: [phone]

## 2017-09-23 NOTE — Telephone Encounter (Signed)
Need to see him to be able to comment. He missed his appointment June 27th for me to be able to evaluate.

## 2017-09-26 NOTE — Telephone Encounter (Signed)
Scheduled patient for this Friday to be seen

## 2017-09-30 ENCOUNTER — Encounter: Payer: Self-pay | Admitting: Family Medicine

## 2017-09-30 ENCOUNTER — Ambulatory Visit: Payer: 59 | Admitting: Family Medicine

## 2017-09-30 VITALS — BP 128/78 | HR 105 | Temp 98.2°F | Ht 72.0 in | Wt 196.4 lb

## 2017-09-30 DIAGNOSIS — F419 Anxiety disorder, unspecified: Secondary | ICD-10-CM

## 2017-09-30 DIAGNOSIS — I10 Essential (primary) hypertension: Secondary | ICD-10-CM

## 2017-09-30 DIAGNOSIS — F411 Generalized anxiety disorder: Secondary | ICD-10-CM | POA: Diagnosis not present

## 2017-09-30 LAB — CBC
HEMATOCRIT: 48.9 % (ref 38.5–50.0)
Hemoglobin: 16.5 g/dL (ref 13.2–17.1)
MCH: 28.3 pg (ref 27.0–33.0)
MCHC: 33.7 g/dL (ref 32.0–36.0)
MCV: 83.9 fL (ref 80.0–100.0)
MPV: 9.7 fL (ref 7.5–12.5)
Platelets: 367 10*3/uL (ref 140–400)
RBC: 5.83 10*6/uL — ABNORMAL HIGH (ref 4.20–5.80)
RDW: 13.2 % (ref 11.0–15.0)
WBC: 6.8 10*3/uL (ref 3.8–10.8)

## 2017-09-30 LAB — COMPREHENSIVE METABOLIC PANEL
AG Ratio: 2.4 (calc) (ref 1.0–2.5)
ALKALINE PHOSPHATASE (APISO): 72 U/L (ref 40–115)
ALT: 64 U/L — ABNORMAL HIGH (ref 9–46)
AST: 22 U/L (ref 10–40)
Albumin: 5.3 g/dL — ABNORMAL HIGH (ref 3.6–5.1)
BILIRUBIN TOTAL: 0.5 mg/dL (ref 0.2–1.2)
BUN: 19 mg/dL (ref 7–25)
CALCIUM: 9.8 mg/dL (ref 8.6–10.3)
CO2: 26 mmol/L (ref 20–32)
Chloride: 101 mmol/L (ref 98–110)
Creat: 1.03 mg/dL (ref 0.60–1.35)
Globulin: 2.2 g/dL (calc) (ref 1.9–3.7)
Glucose, Bld: 99 mg/dL (ref 65–99)
POTASSIUM: 4.2 mmol/L (ref 3.5–5.3)
Sodium: 136 mmol/L (ref 135–146)
Total Protein: 7.5 g/dL (ref 6.1–8.1)

## 2017-09-30 LAB — TSH: TSH: 1.73 m[IU]/L (ref 0.40–4.50)

## 2017-09-30 MED ORDER — ESCITALOPRAM OXALATE 5 MG PO TABS: 5.0000 mg | ORAL_TABLET | Freq: Every day | ORAL | 2 refills | Status: DC

## 2017-09-30 NOTE — Progress Notes (Signed)
Subjective:  Daryl Pace is a 42 y.o. year old very pleasant male patient who presents for/with See problem oriented charting ROS- butterflies in stomach. Dizziness improving. No chest pain or shortness of breath.    Past Medical History-  Patient Active Problem List   Diagnosis Date Noted  . Current smoker 11/20/2014    Priority: High  . Hyperlipidemia 03/06/2015    Priority: Medium  . Hypertension     Priority: Medium  . GAD (generalized anxiety disorder) 10/01/2017  . Atypical chest pain 07/10/2015    Medications- reviewed and updated Current Outpatient Medications  Medication Sig Dispense Refill  . amoxicillin (AMOXIL) 500 MG tablet Take 500 mg by mouth 3 (three) times daily.    . busPIRone (BUSPAR) 7.5 MG tablet Take 1 tablet (7.5 mg total) by mouth 3 (three) times daily. 90 tablet 0  . chlorhexidine (PERIDEX) 0.12 % solution Use as directed 15 mLs in the mouth or throat 2 (two) times daily.    Marland Kitchen ibuprofen (ADVIL,MOTRIN) 200 MG tablet Take 800 mg by mouth daily as needed for moderate pain.    Marland Kitchen lidocaine (LIDODERM) 5 % Place 1 patch onto the skin daily. Remove & Discard patch within 12 hours or as directed by MD 30 patch 0  . lisinopril (PRINIVIL,ZESTRIL) 10 MG tablet Take 1 tablet (10 mg total) by mouth daily. 90 tablet 0  . methocarbamol (ROBAXIN) 500 MG tablet Take 1 tablet (500 mg total) by mouth 2 (two) times daily. 20 tablet 0   No current facility-administered medications for this visit.     Objective: BP 128/78 (BP Location: Left Arm, Patient Position: Sitting, Cuff Size: Normal)   Pulse (!) 105   Temp 98.2 F (36.8 C) (Oral)   Ht 6' (1.829 m)   Wt 196 lb 6.4 oz (89.1 kg)   SpO2 96%   BMI 26.64 kg/m  Gen: NAD, resting comfortably CV: RRR no murmurs rubs or gallops Lungs: CTAB no crackles, wheeze, rhonchi Abdomen: soft/nontender/nondistended/normal bowel sounds.   Ext: no edema Skin: warm, dry, bottom of left foot on midfoot there is a raised slightly  hyperpigmented area that is soft and nontender Neuro: grossly normal, moves all extremities Psych: anxious appearing, slightly sweaty  EKG done to initial tachycardia. On EKG sinus rhytm with rate 66, normal axis, normal intervals, no hypertrophy, no st or t wave changes  Assessment/Plan:  Hypertension S: controlled on lisinopril 7m. Patient had been getting dizy episodes for seeral years but intensified recently when off BP meds.  Episodes usually 3-4 minutes.  BP Readings from Last 3 Encounters:  09/30/17 128/78  09/15/17 (!) 142/84  09/06/17 (!) 130/97  A/P: We discussed blood pressure goal of <140/90. Continue current meds:  BP looks great on lisinopril so should continue  GAD (generalized anxiety disorder) S: Patient has been dealing with anxiety. He was started on busprione by Dr. WRogers Blockeron the 27th of June. GAD7 elevated 17 a month ago, 19 today   Gets butterfiles in his stomach (butterflies last 45 mins). Not worse with head movement. Tinnitus but not new and not worsened with this. Butterflies can happen even without dizziness (from when off BP meds) and has feeling of impending doom and pulls to side of road if driving. Happening almost daily and impairing his life. Negative dix hallpike last visit. Takes him 40 minutes to get to work and it is common on way to work-he has not been to work over last month though and continues to have  episodes- most common with driving but happens at other times. Fluttering mainly in stomach but can go to chest.   Has to cool off his mind. He has to get in his car- let air blow on him to let this blow over. No accidents in his past that were severe. Can happen when he is in line at a store. They happen at his home as well. Work may have been more stressful.   No chest pain or shortness of breath. No blood pressure cuff at home- willing to get one. Down 10 lbs per his report. Denies diarrhea but has been gassier. Was having some Dizziness better since  being on BP meds- rare now.    Started on busprione last visit 7.63m BID- TID. He took this once and started crying so hasnt taken again.   Out of work since June 20th. Doesn't feel like he is able to work.   Patient feels like issues started when he started having dental pain. Emergency dentist- says has a tooth laying on a nerve and needs pulled. That has weighed on him- appointment for the 29th planned. He is going to see if he can do it sooner. He is going to call dentist back about extending amoxicillin.   Had ER trip for back pain last month. Cbc, bmp, troponin were reassuring.  A/P: EKG reassuring today- doubt this is cardiac in etiology (though could consider cardiac monitor if not improving on SSRI). Needs to check home BP and HR with episodes- if HR really high or BP is- may need to consider monitoring or eval pheochromocytoma.   Will start lexapro at low dose 562mand reassess in 2 weeks to see if we can go to 1057mWrote patient out of work from last month until now- plus may need additional work up/time off   Future Appointments  Date Time Provider [place]/26/2019 11:30 AM HunYong ChannelteBrayton MarsD [facility]   No follow-ups on file.  Lab/Order associations: Essential hypertension - Plan: EKG 12-Lead, TSH, Comprehensive metabolic panel, CBC, CBC, Comprehensive metabolic panel, TSH, CANCELED: CBC, CANCELED: Comprehensive metabolic panel, CANCELED: TSH  Anxiety - Plan: TSH, Comprehensive metabolic panel, CBC, CBC, Comprehensive metabolic panel, TSH, CANCELED: CBC, CANCELED: Comprehensive metabolic panel, CANCELED: TSH  GAD (generalized anxiety disorder)  Meds ordered this encounter  Medications  . escitalopram (LEXAPRO) 5 MG tablet    Sig: Take 1 tablet (5 mg total) by mouth daily.    Dispense:  30 tablet    Refill:  2    Return precautions advised.  SteGarret ReddishD

## 2017-09-30 NOTE — Patient Instructions (Addendum)
Please stop by the lab before you go.  Buy a blood pressure cuff. I want some recordings of your blood pressures and heart rates when you are having these episodes. If your heart rate is high- may need to do a heart monitor. If blood pressure high , may need other testing.   Try to get the dental apointment moved up.   Blood pressure looks much better- glad dizziness is better  Start lexapro/escitalopram 12m  Taking the medicine as directed and not missing any doses is one of the best things you can do to treat your anxiety.  Here are some things to keep in mind:  1) Side effects (stomach upset, some increased anxiety) may happen before you notice a benefit.  These side effects typically go away over time. 2) Changes to your dose of medicine or a change in medication all together is sometimes necessary 3) Most people need to be on medication at least 6-12 months 4) Many people will notice an improvement within two weeks but the full effect of the medication can take up to 4-6 weeks 5) Stopping the medication when you start feeling better often results in a return of symptoms 6) If you start having thoughts of hurting yourself or others after starting this medicine, call our office immediately at [phone] seek care through 911.    Schedule a follow up with me in 2 weeks. Doesn't sound like its time for you to start back at work yet.

## 2017-10-01 DIAGNOSIS — F411 Generalized anxiety disorder: Secondary | ICD-10-CM | POA: Insufficient documentation

## 2017-10-01 NOTE — Assessment & Plan Note (Signed)
S: controlled on lisinopril 33m. Patient had been getting dizy episodes for seeral years but intensified recently when off BP meds.  Episodes usually 3-4 minutes.  BP Readings from Last 3 Encounters:  09/30/17 128/78  09/15/17 (!) 142/84  09/06/17 (!) 130/97  A/P: We discussed blood pressure goal of <140/90. Continue current meds:  BP looks great on lisinopril so should continue

## 2017-10-01 NOTE — Assessment & Plan Note (Signed)
S: Patient has been dealing with anxiety. He was started on busprione by Dr. Rogers Blocker on the 27th of June. GAD7 elevated 17 a month ago, 19 today   Gets butterfiles in his stomach (butterflies last 45 mins). Not worse with head movement. Tinnitus but not new and not worsened with this. Butterflies can happen even without dizziness (from when off BP meds) and has feeling of impending doom and pulls to side of road if driving. Happening almost daily and impairing his life. Negative dix hallpike last visit. Takes him 40 minutes to get to work and it is common on way to work-he has not been to work over last month though and continues to have episodes- most common with driving but happens at other times. Fluttering mainly in stomach but can go to chest.   Has to cool off his mind. He has to get in his car- let air blow on him to let this blow over. No accidents in his past that were severe. Can happen when he is in line at a store. They happen at his home as well. Work may have been more stressful.   No chest pain or shortness of breath. No blood pressure cuff at home- willing to get one. Down 10 lbs per his report. Denies diarrhea but has been gassier. Was having some Dizziness better since being on BP meds- rare now.    Started on busprione last visit 7.32m BID- TID. He took this once and started crying so hasnt taken again.   Out of work since June 20th. Doesn't feel like he is able to work.   Patient feels like issues started when he started having dental pain. Emergency dentist- says has a tooth laying on a nerve and needs pulled. That has weighed on him- appointment for the 29th planned. He is going to see if he can do it sooner. He is going to call dentist back about extending amoxicillin.   Had ER trip for back pain last month. Cbc, bmp, troponin were reassuring.  A/P: EKG reassuring today- doubt this is cardiac in etiology (though could consider cardiac monitor if not improving on SSRI). Needs to  check home BP and HR with episodes- if HR really high or BP is- may need to consider monitoring or eval pheochromocytoma.   Will start lexapro at low dose 577mand reassess in 2 weeks to see if we can go to 1054mWrote patient out of work from last month until now- plus may need additional work up/time off

## 2017-10-07 ENCOUNTER — Telehealth: Payer: Self-pay | Admitting: Family Medicine

## 2017-10-07 NOTE — Telephone Encounter (Signed)
See note.  Copied from [place] [id number]. Topic: Inquiry >> Oct 07, 2017  3:37 PM Pricilla Handler wrote: Reason for CRM: Patient needs FMLA/Short Term Paperwork completed for his job/payroll. Patient's girlfriend says that Dr. Yong Channel was to complete this information. This information needs to be faxed to [facility] with [employer] (PHN # [phone] EXT [id number]; FAX # [phone]). Without this paperwork, the claim will not be completed and the patient will not receive payment for being out of work. Please call patient at [phone] or [phone].      Thank You!!!

## 2017-10-11 NOTE — Telephone Encounter (Signed)
Paperwork has been signed and faxed to number [phone] this morning. I have received fax confirmation that it sent successfully.

## 2017-10-13 ENCOUNTER — Ambulatory Visit: Payer: 59 | Admitting: Family Medicine

## 2017-10-14 ENCOUNTER — Ambulatory Visit: Payer: 59 | Admitting: Family Medicine

## 2017-10-14 ENCOUNTER — Encounter: Payer: Self-pay | Admitting: Family Medicine

## 2017-10-14 VITALS — BP 124/86 | HR 88 | Temp 98.5°F | Ht 72.0 in | Wt 187.4 lb

## 2017-10-14 DIAGNOSIS — R7989 Other specified abnormal findings of blood chemistry: Secondary | ICD-10-CM

## 2017-10-14 DIAGNOSIS — R945 Abnormal results of liver function studies: Secondary | ICD-10-CM | POA: Diagnosis not present

## 2017-10-14 DIAGNOSIS — F17200 Nicotine dependence, unspecified, uncomplicated: Secondary | ICD-10-CM | POA: Diagnosis not present

## 2017-10-14 DIAGNOSIS — I10 Essential (primary) hypertension: Secondary | ICD-10-CM | POA: Diagnosis not present

## 2017-10-14 DIAGNOSIS — F411 Generalized anxiety disorder: Secondary | ICD-10-CM

## 2017-10-14 LAB — HEPATIC FUNCTION PANEL
ALT: 31 U/L (ref 0–53)
AST: 15 U/L (ref 0–37)
Albumin: 4.8 g/dL (ref 3.5–5.2)
Alkaline Phosphatase: 70 U/L (ref 39–117)
BILIRUBIN TOTAL: 0.5 mg/dL (ref 0.2–1.2)
Bilirubin, Direct: 0.1 mg/dL (ref 0.0–0.3)
Total Protein: 7.8 g/dL (ref 6.0–8.3)

## 2017-10-14 MED ORDER — ESCITALOPRAM OXALATE 10 MG PO TABS: 10.0000 mg | ORAL_TABLET | Freq: Every day | ORAL | 5 refills | Status: DC

## 2017-10-14 NOTE — Assessment & Plan Note (Signed)
S: GAD7 shows impressive improvement down to 10 from 17 last visit with just 2 weeks on  lexapro 25m (prior he had minimal improvement on buspirone through Dr. WRogers Blocker. We kept him out of work for the last month with plan for reassessment today due to severity of anxiety last visit.  Last visit he described- Anxiety presented as butterflies in chest and stomach for 45 minutes and feeling impending doom. Happned most often with driving, particularly to work. Needed time for mind to cool off. Happened more often when out shopping but could happen at home as well. Last visit he though his issues were related to dental pain- he had a tooth laying on a nerve that needed to be pulled.   Today, he updates me that he had 4 teeth pulled- all wisdom teeth. He has not been eating well at all and lost nearly 10 lbs. States before this he did feel like anxiety was improving- he hasnt driven since then as he is on pain medicine.   At home he thinks he is at least 25% better on anxiety- may be both shorter and less intense. He is hoping to get off the pain medicine to see how he is really feeling.   He was to check home BP and HR with episodes- we were going to consider pheochromocytoma work up if signficant bp elevations. BP has been ok with butterfly episodes- really reassuring. HR goes up some with anxiety- never above 110.    A/P: We are going to increase lexapro to 184mand reassess I 2-3 weeks. For short term disability over 30 days he needs a psychiatrist approval so I gave him a list of psychiatrists in town today. He needs to be off pain medicines before we can get a good picture of his anxiety plus needs to be able to drive likely to get back to work

## 2017-10-14 NOTE — Patient Instructions (Addendum)
Blood pressure looks great  Increase lexapro to 53m for anxiety. Lets reassess in 2-4 weeks (schedule before you leave).   Great job on not smoking!!!! Keep it up   Please stop by lab before you go

## 2017-10-14 NOTE — Assessment & Plan Note (Signed)
No smoking in 2 weeks. Congratulated on process- check in at follow up. Change to former smoker if quits for at least 6 months.

## 2017-10-14 NOTE — Assessment & Plan Note (Signed)
S: controlled on lisinopril 61m. Dizziness remains better on bp meds BP Readings from Last 3 Encounters:  10/14/17 124/86  09/30/17 128/78  09/15/17 (!) 142/84  A/P: We discussed blood pressure goal of <140/90. Continue current meds

## 2017-10-14 NOTE — Progress Notes (Signed)
Subjective:  Daryl Pace is a 42 y.o. year old very pleasant male patient who presents for/with See problem oriented charting ROS- dental pain, some mental fogginess on pain medicine. Some anxiety/butterfly sensations but overall improved   Past Medical History-  Patient Active Problem List   Diagnosis Date Noted  . Current smoker 11/20/2014    Priority: High  . Hyperlipidemia 03/06/2015    Priority: Medium  . Hypertension     Priority: Medium  . GAD (generalized anxiety disorder) 10/01/2017  . Atypical chest pain 07/10/2015    Medications- reviewed and updated Current Outpatient Medications  Medication Sig Dispense Refill  . chlorhexidine (PERIDEX) 0.12 % solution Use as directed 15 mLs in the mouth or throat 2 (two) times daily.    Marland Kitchen escitalopram (LEXAPRO) 5 MG tablet Take 1 tablet (5 mg total) by mouth daily. 30 tablet 5  . HYDROcodone-acetaminophen (NORCO/VICODIN) 5-325 MG tablet Take 1 tablet by mouth every 6 (six) hours as needed. for pain  0  . ibuprofen (ADVIL,MOTRIN) 600 MG tablet Take 1 tablet by mouth 2 (two) times daily as needed.  0  . lisinopril (PRINIVIL,ZESTRIL) 10 MG tablet Take 1 tablet (10 mg total) by mouth daily. 90 tablet 0  . penicillin v potassium (VEETID) 500 MG tablet Take 1 tablet by mouth 4 (four) times daily.  0   No current facility-administered medications for this visit.     Objective: BP 124/86 (BP Location: Left Arm, Patient Position: Sitting, Cuff Size: Normal)   Pulse 88 Comment: Manual   Temp 98.5 F (36.9 C) (Oral)   Ht 6' (1.829 m)   Wt 187 lb 6.4 oz (85 kg)   SpO2 95%   BMI 25.42 kg/m  Gen: NAD, resting comfortably Sequelae of wisdom tooth removal noted CV: RRR no murmurs rubs or gallops Lungs: CTAB no crackles, wheeze, rhonchi Abdomen: soft/nontender/nondistended/normal bowel sounds.  Ext: no edema Skin: warm, dry  Assessment/Plan:  LFT elevation - Plan: Hepatic function panel S: slight ALT bump last visit. Patient  states not drinking alcohol Lab Results  Component Value Date   ALT 64 (H) 09/30/2017   AST 22 09/30/2017   ALKPHOS 69 03/06/2015   BILITOT 0.5 09/30/2017  A/P:Update lfts today, consider further workup if AST/ALT >3x ULN  Hypertension S: controlled on lisinopril 60m. Dizziness remains better on bp meds BP Readings from Last 3 Encounters:  10/14/17 124/86  09/30/17 128/78  09/15/17 (!) 142/84  A/P: We discussed blood pressure goal of <140/90. Continue current meds  Current smoker No smoking in 2 weeks. Congratulated on process- check in at follow up. Change to former smoker if quits for at least 6 months.   GAD (generalized anxiety disorder) S: GAD7 shows impressive improvement down to 10 from 17 last visit with just 2 weeks on  lexapro 524m(prior he had minimal improvement on buspirone through Dr. WoRogers Blocker We kept him out of work for the last month with plan for reassessment today due to severity of anxiety last visit.  Last visit he described- Anxiety presented as butterflies in chest and stomach for 45 minutes and feeling impending doom. Happned most often with driving, particularly to work. Needed time for mind to cool off. Happened more often when out shopping but could happen at home as well. Last visit he though his issues were related to dental pain- he had a tooth laying on a nerve that needed to be pulled.   Today, he updates me that he had 4 teeth pulled-  all wisdom teeth. He has not been eating well at all and lost nearly 10 lbs. States before this he did feel like anxiety was improving- he hasnt driven since then as he is on pain medicine.   At home he thinks he is at least 25% better on anxiety- may be both shorter and less intense. He is hoping to get off the pain medicine to see how he is really feeling.   He was to check home BP and HR with episodes- we were going to consider pheochromocytoma work up if signficant bp elevations. BP has been ok with butterfly episodes-  really reassuring. HR goes up some with anxiety- never above 110.    A/P: We are going to increase lexapro to 49m and reassess I 2-3 weeks. For short term disability over 30 days he needs a psychiatrist approval so I gave him a list of psychiatrists in town today. He needs to be off pain medicines before we can get a good picture of his anxiety plus needs to be able to drive likely to get back to work  Future Appointments  Date Time Provider [place] 10/28/2017  3:00 PM HMarin Olp MD [facility]   Lab/Order associations: LFT elevation - Plan: Hepatic function panel  Essential hypertension  Current smoker  GAD (generalized anxiety disorder)  Meds ordered this encounter  Medications  . escitalopram (LEXAPRO) 10 MG tablet    Sig: Take 1 tablet (10 mg total) by mouth daily.    Dispense:  30 tablet    Refill:  5    Return precautions advised.  SGarret Reddish MD

## 2017-10-17 NOTE — Telephone Encounter (Signed)
Patient states his last OV note needs to be sent to Cecille Rubin with [employer] in order for his paycheck for STD can come to patient. Please call pt once OV is faxed.

## 2017-10-17 NOTE — Telephone Encounter (Signed)
Form for work status was not received back with office notes. Please resubmit fax# [phone]

## 2017-10-17 NOTE — Telephone Encounter (Signed)
What date can patient return to work after reevaluation on 10/14/17?

## 2017-10-17 NOTE — Telephone Encounter (Signed)
Office visit has been faxed to [facility] at [facility]. [phone].

## 2017-10-18 NOTE — Telephone Encounter (Signed)
Noted. Paperwork has been faxed [phone] with new reevaluation date 10/28/17 on it.

## 2017-10-18 NOTE — Telephone Encounter (Signed)
We are going to reevaluate on 10/28/17 to see if he can go back. You can use this date for date of reevaluation. If we cant get him back then he will need to see psychiatry most likely if its from an anxiety perspective.

## 2017-10-28 ENCOUNTER — Encounter: Payer: Self-pay | Admitting: Family Medicine

## 2017-10-28 ENCOUNTER — Ambulatory Visit: Payer: 59 | Admitting: Family Medicine

## 2017-10-28 VITALS — BP 118/72 | HR 99 | Temp 98.1°F | Ht 72.0 in | Wt 187.8 lb

## 2017-10-28 DIAGNOSIS — F17200 Nicotine dependence, unspecified, uncomplicated: Secondary | ICD-10-CM | POA: Diagnosis not present

## 2017-10-28 DIAGNOSIS — F411 Generalized anxiety disorder: Secondary | ICD-10-CM

## 2017-10-28 NOTE — Assessment & Plan Note (Signed)
3 cigarettes since last visit- close to quitting - encouraged full cessation

## 2017-10-28 NOTE — Progress Notes (Signed)
Subjective:  Daryl Pace is a 42 y.o. year old very pleasant male patient who presents for/with See problem oriented charting ROS- still with some mouth pain- much improved- now off opiates after wisdom teeth pulle.d no chest pain or shortness of breath. Some fluttering in chest but much less intense.    Past Medical History-  Patient Active Problem List   Diagnosis Date Noted  . Current smoker 11/20/2014    Priority: High  . Hyperlipidemia 03/06/2015    Priority: Medium  . Hypertension     Priority: Medium  . GAD (generalized anxiety disorder) 10/01/2017  . Atypical chest pain 07/10/2015    Medications- reviewed and updated Current Outpatient Medications  Medication Sig Dispense Refill  . escitalopram (LEXAPRO) 10 MG tablet Take 1 tablet (10 mg total) by mouth daily. 30 tablet 5  . lisinopril (PRINIVIL,ZESTRIL) 10 MG tablet Take 1 tablet (10 mg total) by mouth daily. 90 tablet 0   No current facility-administered medications for this visit.     Objective: BP 118/72 (BP Location: Left Arm, Patient Position: Sitting, Cuff Size: Normal)   Pulse 99   Temp 98.1 F (36.7 C) (Oral)   Ht 6' (1.829 m)   Wt 187 lb 12.8 oz (85.2 kg)   SpO2 95%   BMI 25.47 kg/m  Gen: NAD, resting comfortably CV: RRR no murmurs rubs or gallops Lungs: CTAB no crackles, wheeze, rhonchi Ext: no edema Skin: warm, dry Less anxious appearing  Assessment/Plan:  Current smoker 3 cigarettes since last visit- close to quitting - encouraged full cessation  GAD (generalized anxiety disorder) S: GAD7 at 9 today down from peak of 17. He is on lexapro 27m. He needed psychiatrist approval for short term disability over 30 days so referred him last visit (gave handout with providers)  Anxiety presented as butterflies in stomach and chest which were severe and had feeling of impending doom- most common with driving and particularly if was thinking about work. He had severe anxiety around dental pain and  needed a tooth pulled that was on a nerve root- last visit he had 4 teeth pulled (all wisdom teeth) and had lost 10 lbs after that. His anxiety had improved prior to that. He thought he was at least 25% better on anxiety last visit. Doubt something like pheochromocytoma as BP doesn't elevate with episodes. HR never got above 110.   Today,  he tells me was able to start driving 5 days ago - may get a light fluttering before driving but doesn't intensify and can drive without issues. Feels like it doesn't get to his brain anymore- no impending doom.   First time he took medicine had prolonged jittery feeling. Improving but still For first 30 minutes he feels somewhat jittery- this is challenging for him as he either would be taking before going to work and feels like wouldn't do well driving or would be at work with heavy machinery. He also has to work at night sometimes and so cant advise nighttime dosing. He seems to do better once medicine gets into his system.  A/P: improved overall anxiety. He is still having some anxiety on lexapro 19mfor first 30 minutes and cant find a good consistent time to use with his work schedule- given this we opted to give him 1 more week out of work to see if he could adjust to medicine better so he can get back to work full time. Considered lexapro 1032mith GAD7 still >5 but will hold off  given symptoms when he takes medicine  He needs psychiatrist for short term disability needs- unfortunately cant get in until October and he called 4 places and asked his EAP for help.   Future Appointments  Date Time Provider [place]  11/04/2017  3:45 PM Marin Olp, MD [facility]   Time Stamp The duration of face-to-face time during this visit was greater than 15 minutes. Greater than 50% of this time was spent in counseling, explanation of diagnosis, planning of further management, and/or coordination of care including discussion of lexapro side effects, how to  handle anxiety when it occurs.    Return precautions advised.  [name], MD

## 2017-10-28 NOTE — Patient Instructions (Signed)
Follow up in 1 week. Hoping to get you back to work after that.   Continue lexapro 11m- glad you are doing so much better

## 2017-10-28 NOTE — Assessment & Plan Note (Addendum)
S: GAD7 at 9 today down from peak of 17. He is on lexapro 43m. He needed psychiatrist approval for short term disability over 30 days so referred him last visit (gave handout with providers)  Anxiety presented as butterflies in stomach and chest which were severe and had feeling of impending doom- most common with driving and particularly if was thinking about work. He had severe anxiety around dental pain and needed a tooth pulled that was on a nerve root- last visit he had 4 teeth pulled (all wisdom teeth) and had lost 10 lbs after that. His anxiety had improved prior to that. He thought he was at least 25% better on anxiety last visit. Doubt something like pheochromocytoma as BP doesn't elevate with episodes. HR never got above 110.   Today,  he tells me was able to start driving 5 days ago - may get a light fluttering before driving but doesn't intensify and can drive without issues. Feels like it doesn't get to his brain anymore- no impending doom.   First time he took medicine had prolonged jittery feeling. Improving but still For first 30 minutes he feels somewhat jittery- this is challenging for him as he either would be taking before going to work and feels like wouldn't do well driving or would be at work with heavy machinery. He also has to work at night sometimes and so cant advise nighttime dosing. He seems to do better once medicine gets into his system.  A/P: improved overall anxiety. He is still having some anxiety on lexapro 152mfor first 30 minutes and cant find a good consistent time to use with his work schedule- given this we opted to give him 1 more week out of work to see if he could adjust to medicine better so he can get back to work full time. Considered lexapro 1052mith GAD7 still >5 but will hold off given symptoms when he takes medicine  He needs psychiatrist for short term disability needs- unfortunately cant get in until October and he called 4 places and asked his EAP for  help.

## 2017-11-03 ENCOUNTER — Telehealth: Payer: Self-pay | Admitting: Family Medicine

## 2017-11-03 NOTE — Telephone Encounter (Signed)
Copied from [place] [id number]. Topic: Quick Communication - See Telephone Encounter >> Nov 03, 2017  9:35 AM Rutherford Nail, NT wrote: CRM for notification. See Telephone encounter for: 11/03/17. Ivar Drape with [employer] calling and states that she faxed over a request for the office notes from 10/28/17 and is just following up on getting those faxed to her. Please advise. Fax#: [phone] CB#: [phone] ext [phone]

## 2017-11-03 NOTE — Telephone Encounter (Signed)
Office notes from 10/28/2017 has been faxed to [place] at [place]. Waiting for the confirmation now. I also faxed over the letter Dr. Yong Channel gave for him to be out of work until 8/19.

## 2017-11-04 ENCOUNTER — Ambulatory Visit: Payer: 59 | Admitting: Family Medicine

## 2017-11-04 ENCOUNTER — Encounter: Payer: Self-pay | Admitting: Family Medicine

## 2017-11-04 VITALS — BP 118/78 | HR 88 | Temp 99.3°F | Ht 72.0 in | Wt 186.0 lb

## 2017-11-04 DIAGNOSIS — F411 Generalized anxiety disorder: Secondary | ICD-10-CM

## 2017-11-04 DIAGNOSIS — I10 Essential (primary) hypertension: Secondary | ICD-10-CM

## 2017-11-04 MED ORDER — ESCITALOPRAM OXALATE 10 MG PO TABS: 10.0000 mg | ORAL_TABLET | Freq: Every day | ORAL | 5 refills | Status: DC

## 2017-11-04 MED ORDER — ESCITALOPRAM OXALATE 10 MG PO TABS: 10.0000 mg | ORAL_TABLET | Freq: Every day | ORAL | 1 refills | Status: DC

## 2017-11-04 MED ORDER — LISINOPRIL 10 MG PO TABS: 10.0000 mg | ORAL_TABLET | Freq: Every day | ORAL | 1 refills | Status: DC

## 2017-11-04 NOTE — Progress Notes (Signed)
Subjective:  Daryl Pace is a 42 y.o. year old very pleasant male patient who presents for/with See problem oriented charting ROS- some fluttering in chest but no chest pain. Still with anxiety but improved. No SI. No shortness of breath   Past Medical History-  Patient Active Problem List   Diagnosis Date Noted  . Current smoker 11/20/2014    Priority: High  . GAD (generalized anxiety disorder) 10/01/2017    Priority: Medium  . Hyperlipidemia 03/06/2015    Priority: Medium  . Hypertension     Priority: Medium  . Atypical chest pain 07/10/2015    Medications- reviewed and updated Current Outpatient Medications  Medication Sig Dispense Refill  . escitalopram (LEXAPRO) 10 MG tablet Take 1 tablet (10 mg total) by mouth daily. 90 tablet 1  . lisinopril (PRINIVIL,ZESTRIL) 10 MG tablet Take 1 tablet (10 mg total) by mouth daily. 90 tablet 1   No current facility-administered medications for this visit.     Objective: BP 118/78 (BP Location: Left Arm, Patient Position: Sitting, Cuff Size: Normal)   Pulse 88   Temp 99.3 F (37.4 C) (Oral)   Ht 6' (1.829 m)   Wt 186 lb (84.4 kg)   SpO2 96%   BMI 25.23 kg/m  Gen: NAD, resting comfortably CV: RRR no murmurs rubs or gallops Lungs: CTAB no crackles, wheeze, rhonchi Abdomen: soft/nontender/nondistended/normal bowel sounds. No rebound or guarding.  Ext: no edema Skin: warm, dry  Assessment/Plan:  Hypertension S: controlled on 79m BP Readings from Last 3 Encounters:  11/04/17 118/78  10/28/17 118/72  10/14/17 124/86  A/P: We discussed blood pressure goal of <140/90. Continue current meds. Refilled for 6 months. Will see him back at that time    GAD (generalized anxiety disorder) S: anxiety much improved now with GAD7 under 5 on 115mlexapro. He still gets a butterfly sensation in his chest but much less intense- able to drive without difficulty. He has some anxiety about returning to work 2 days in a row next week-  since his overall anxiety is improved I encouraged him to move forward with trial A/P: continue lexapro 1071mRestart work next week- will send release to work. For his short term disability paperwork he is to still establish with psychiatry     Future Appointments  Date Time Provider [place]  2:45 PM HunMarin OlpD [facility]    Meds ordered this encounter  Medications  . DISCONTD: escitalopram (LEXAPRO) 10 MG tablet    Sig: Take 1 tablet (10 mg total) by mouth daily.    Dispense:  30 tablet    Refill:  5  . lisinopril (PRINIVIL,ZESTRIL) 10 MG tablet    Sig: Take 1 tablet (10 mg total) by mouth daily.    Dispense:  90 tablet    Refill:  1  . escitalopram (LEXAPRO) 10 MG tablet    Sig: Take 1 tablet (10 mg total) by mouth daily.    Dispense:  90 tablet    Refill:  1   Return precautions advised.  SteGarret ReddishD

## 2017-11-04 NOTE — Assessment & Plan Note (Signed)
S: controlled on 17m BP Readings from Last 3 Encounters:  11/04/17 118/78  10/28/17 118/72  10/14/17 124/86  A/P: We discussed blood pressure goal of <140/90. Continue current meds. Refilled for 6 months. Will see him back at that time

## 2017-11-04 NOTE — Assessment & Plan Note (Signed)
S: anxiety much improved now with GAD7 under 5 on 70m lexapro. He still gets a butterfly sensation in his chest but much less intense- able to drive without difficulty. He has some anxiety about returning to work 2 days in a row next week- since his overall anxiety is improved I encouraged him to move forward with trial A/P: continue lexapro 130m Restart work next week- will send release to work. For his short term disability paperwork he is to still establish with psychiatry

## 2017-11-04 NOTE — Patient Instructions (Addendum)
Return to work Monday  6 month follow for blood pressure and anxiety  Glad you are doing better.

## 2017-12-16 ENCOUNTER — Telehealth: Payer: Self-pay | Admitting: Family Medicine

## 2017-12-16 NOTE — Telephone Encounter (Signed)
See note

## 2017-12-16 NOTE — Telephone Encounter (Signed)
Attempted to call patient regarding his request for a refill for lisinopril. No answer, left message for him to call his pharmacy.  This med was last filled on 11/04/17 for 90 tabs and one refill. Pharmacy  [phone] Asked pt to call us back if any other concerns.

## 2017-12-16 NOTE — Telephone Encounter (Signed)
Copied from [place] [id number]. Topic: Quick Communication - Rx Refill/Question >> Dec 16, 2017  1:08 PM Burchel, Abbi R wrote: Medication: lisinopril (PRINIVIL,ZESTRIL) 10 MG tablet  [facility]acy: [id number] [place] [address]: [phone]: [phone]   Pt was advised that RX refills may take up to 3 business days. We ask that you follow-up with your pharmacy.

## 2018-05-08 ENCOUNTER — Ambulatory Visit: Payer: 59 | Admitting: Family Medicine

## 2018-05-22 ENCOUNTER — Ambulatory Visit (INDEPENDENT_AMBULATORY_CARE_PROVIDER_SITE_OTHER): Payer: 59 | Admitting: Family Medicine

## 2018-05-22 ENCOUNTER — Encounter: Payer: Self-pay | Admitting: Family Medicine

## 2018-05-22 VITALS — BP 120/60 | HR 93 | Temp 98.2°F | Ht 72.0 in | Wt 202.2 lb

## 2018-05-22 DIAGNOSIS — F411 Generalized anxiety disorder: Secondary | ICD-10-CM | POA: Diagnosis not present

## 2018-05-22 DIAGNOSIS — E785 Hyperlipidemia, unspecified: Secondary | ICD-10-CM

## 2018-05-22 DIAGNOSIS — L309 Dermatitis, unspecified: Secondary | ICD-10-CM | POA: Insufficient documentation

## 2018-05-22 DIAGNOSIS — F17200 Nicotine dependence, unspecified, uncomplicated: Secondary | ICD-10-CM

## 2018-05-22 DIAGNOSIS — Z0000 Encounter for general adult medical examination without abnormal findings: Secondary | ICD-10-CM

## 2018-05-22 DIAGNOSIS — I10 Essential (primary) hypertension: Secondary | ICD-10-CM

## 2018-05-22 MED ORDER — TRIAMCINOLONE ACETONIDE 0.1 % EX CREA: 1.0000 "application " | TOPICAL_CREAM | Freq: Two times a day (BID) | CUTANEOUS | 2 refills | Status: DC

## 2018-05-22 NOTE — Assessment & Plan Note (Signed)
S: controlled on lexapro 37m. GAD7 reasonably controlled at 5. PHQ9 under < 5. No Si. 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  A/P: Stable. Continue current medications.

## 2018-05-22 NOTE — Patient Instructions (Addendum)
Schedule a lab visit at the check out desk within 2 weeks. Return for future fasting labs meaning nothing but water after midnight please. Ok to take your medications with water.   For patient's eczema- he states he is using an eczema cream twice a day.  For flareups (when thin gets scaly and itchy) I recommended he use triamcinolone cream twice a day and then cover this with a Eucerin/cerave/Vaseline type product.  I also offered to refer to dermatology if he does not make improvement with this.  We discussed if uses the triamcinolone for more than 10 days might cause lightening of his skin  As always-I strongly recommend he quit smoking-this is 1 of the best things you can do for your health  Recommend 39-monthcheck in for blood pressure, anxiety, smoking

## 2018-05-22 NOTE — Progress Notes (Signed)
Phone: [phone]    Subjective:  Patient presents today for their annual physical. Chief complaint-noted.   See problem oriented charting- ROS- full  review of systems was completed and negative except for: occasional chest discomfort that resolves with belching  The following were reviewed and entered/updated in epic: Past Medical History:  Diagnosis Date  . Chicken pox   . Hypertension    diagnosed at work screenings  . Kidney stone    around age 43, peed them out   Patient Active Problem List   Diagnosis Date Noted  . Current smoker 11/20/2014    Priority: High  . GAD (generalized anxiety disorder) 10/01/2017    Priority: Medium  . Hyperlipidemia 03/06/2015    Priority: Medium  . Hypertension     Priority: Medium  . Atypical chest pain 07/10/2015   Past Surgical History:  Procedure Laterality Date  . HERNIA REPAIR     around 25, bilateral    Family History  Problem Relation Age of Onset  . Stroke Mother        early 57s, drop foot  . Hypertension Mother   . Breast cancer Maternal Aunt     Medications- reviewed and updated Current Outpatient Medications  Medication Sig Dispense Refill  . escitalopram (LEXAPRO) 10 MG tablet Take 1 tablet (10 mg total) by mouth daily. 90 tablet 1  . lisinopril (PRINIVIL,ZESTRIL) 10 MG tablet Take 1 tablet (10 mg total) by mouth daily. 90 tablet 1  . triamcinolone cream (KENALOG) 0.1 % Apply 1 application topically 2 (two) times daily. For 7-10 days maximum 80 g 2   No current facility-administered medications for this visit.     Allergies-reviewed and updated No Known Allergies  Social History   Social History Narrative   Family: Single. Girlfriend and 3 kids. 4 people in home. Son that lives in [place]- good situation with former partner.       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         Hobbies: football, basketball with son at park, bowl at the park      Objective:  BP 120/60 (BP Location: Right Arm, Patient Position: Sitting, Cuff Size: Large)   Pulse 93   Temp 98.2 F (36.8 C) (Oral)   Ht 6' (1.829 m)   Wt 202 lb 3.2 oz (91.7 kg)   SpO2 95%   BMI 27.42 kg/m  Gen: NAD, resting comfortably HEENT: Mucous membranes are moist. Oropharynx normal Neck: no thyromegaly, no cervical lymphadenopathy  CV: RRR no murmurs rubs or gallops Lungs: CTAB no crackles, wheeze, rhonchi Abdomen: soft/nontender/nondistended/normal bowel sounds. No rebound or guarding.  Ext: no edema Skin: warm, dry, hyperpigmentation noted on right neck as well as in mid upper back-also has some thickening of skin/excoriation on upper back but none on the neck Neuro: Normal gait and speech, PERRLA    Assessment and Plan:  43 y.o. male presenting for annual physical.  Health Maintenance counseling: 1. Anticipatory guidance: Patient counseled regarding regular dental exams - needs to start q6 months- hasnt been since dentist retired, eye exams - doesn't go- no vision issues,  avoiding smoking and second hand smoke- smokes 1 pack per week , limiting alcohol to 2 beverages per day- 6 pack per week.   2. Risk factor reduction:  Advised patient of need for regular exercise and diet rich and  fruits and vegetables to reduce risk of heart attack and stroke. Exercise- active but no regular exercise- encouraged regular exercise. Discussed hwo that can help with anxiety. Diet-weight went down when wisdom teeth taken out- has gone back up- he is going to try to get this back down at least 10 pounds.  Wt Readings from Last 3 Encounters:  05/22/18 202 lb 3.2 oz (91.7 kg)  11/04/17 186 lb (84.4 kg)  10/28/17 187 lb 12.8 oz (85.2 kg)   3. Immunizations/screenings/ancillary studies Immunization History  Administered Date(s) Administered  . Tdap 11/20/2014  4. Prostate cancer screening- we agreed to start  screening at age 23- 55 years prior to usptf recommendations due to being african american   5. Colon cancer screening - no family history, start at age 52 6. Skin cancer screening/prevention- low risk for skin cancer. advised regular sunscreen use. Denies worrisome, changing, or new skin lesions- other than has been told eczema.  7. Testicular cancer screening- advised monthly self exams  8. STD screening- patient opts out- monogomous with same girlfriend 9. current smoker- 1 pack per week. Get UA- recommended cessation- but he is not ready to quit  Status of chronic or acute concerns   Hypertension S:controlled on lisinopril 41m  A/P: Stable. Continue current medications.   Hyperlipidemia S: poorly controlled on no rx in the past Lab Results  Component Value Date   CHOL 252 (H) 03/06/2015   HDL 43.10 03/06/2015   LDLCALC 188 (H) 03/06/2015   TRIG 107.0 03/06/2015   CHOLHDL 6 03/06/2015   A/P: update lipids and calculate 10 risk  GAD (generalized anxiety disorder) S: controlled on lexapro 135m GAD7 reasonably controlled at 5. PHQ9 under < 5. No Si. 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  A/P: Stable. Continue current medications.   Current smoker Smoking 1 pack per week- advised full cessation but he is not ready to quit   Advised 6-14-montheck in  Lab/Order associations: Will come back fasting Preventative health care - Plan: CBC, Comprehensive metabolic panel, Lipid panel, Urinalysis  Essential hypertension - Plan: Urinalysis  Hyperlipidemia, unspecified hyperlipidemia type - Plan: CBC, Comprehensive metabolic panel, Lipid panel, Urinalysis  GAD (generalized anxiety disorder)  Current smoker - Plan: Urinalysis  Meds ordered this encounter  Medications  . triamcinolone cream (KENALOG) 0.1 %    Sig: Apply 1 application topically 2 (two) times daily. For 7-10 days  maximum    Dispense:  80 g    Refill:  2    Return precautions advised.   SteGarret ReddishD

## 2018-05-22 NOTE — Assessment & Plan Note (Signed)
S: Patient with long-term history of eczema since childhood.  Gets intermittent patches of scaly/red/itchy skin-later gets hyperpigmented areas.  Has largely healed area on his right neck from this past winter as well as area on his back which has some excoriation A/P: For patient's eczema- he states he is using an eczema cream twice a day.  For flareups (when thin gets scaly and itchy) I recommended he use triamcinolone cream twice a day and then cover this with a Eucerin/cerave/Vaseline type product.  I also offered to refer to dermatology if he does not make improvement with this.  We discussed if uses the triamcinolone for more than 10 days might cause lightening of his skin

## 2018-05-22 NOTE — Assessment & Plan Note (Signed)
S: poorly controlled on no rx in the past Lab Results  Component Value Date   CHOL 252 (H) 03/06/2015   HDL 43.10 03/06/2015   LDLCALC 188 (H) 03/06/2015   TRIG 107.0 03/06/2015   CHOLHDL 6 03/06/2015   A/P: update lipids and calculate 10 risk

## 2018-05-22 NOTE — Assessment & Plan Note (Signed)
Smoking 1 pack per week- advised full cessation but he is not ready to quit

## 2018-05-22 NOTE — Assessment & Plan Note (Signed)
S:controlled on lisinopril 12m  A/P: Stable. Continue current medications.

## 2018-06-02 ENCOUNTER — Other Ambulatory Visit (INDEPENDENT_AMBULATORY_CARE_PROVIDER_SITE_OTHER): Payer: 59

## 2018-06-02 ENCOUNTER — Other Ambulatory Visit: Payer: Self-pay

## 2018-06-02 DIAGNOSIS — I10 Essential (primary) hypertension: Secondary | ICD-10-CM

## 2018-06-02 DIAGNOSIS — E785 Hyperlipidemia, unspecified: Secondary | ICD-10-CM

## 2018-06-02 DIAGNOSIS — Z0000 Encounter for general adult medical examination without abnormal findings: Secondary | ICD-10-CM

## 2018-06-02 DIAGNOSIS — F17200 Nicotine dependence, unspecified, uncomplicated: Secondary | ICD-10-CM

## 2018-06-02 LAB — LIPID PANEL
Cholesterol: 258 mg/dL — ABNORMAL HIGH (ref 0–200)
HDL: 38.7 mg/dL — ABNORMAL LOW (ref >39.00)
NonHDL: 218.95
Total CHOL/HDL Ratio: 7
Triglycerides: 254 mg/dL — ABNORMAL HIGH (ref 0.0–149.0)
VLDL: 50.8 mg/dL — ABNORMAL HIGH (ref 0.0–40.0)

## 2018-06-02 LAB — LDL CHOLESTEROL, DIRECT: Direct LDL: 184 mg/dL

## 2018-06-02 LAB — CBC
HCT: 46.1 % (ref 39.0–52.0)
Hemoglobin: 15.4 g/dL (ref 13.0–17.0)
MCHC: 33.3 g/dL (ref 30.0–36.0)
MCV: 87.1 fl (ref 78.0–100.0)
Platelets: 329 10*3/uL (ref 150.0–400.0)
RBC: 5.3 Mil/uL (ref 4.22–5.81)
RDW: 14.3 % (ref 11.5–15.5)
WBC: 6.3 10*3/uL (ref 4.0–10.5)

## 2018-06-02 LAB — URINALYSIS
BILIRUBIN URINE: NEGATIVE
Ketones, ur: NEGATIVE
Leukocytes,Ua: NEGATIVE
Nitrite: NEGATIVE
Specific Gravity, Urine: 1.025 (ref 1.000–1.030)
Total Protein, Urine: NEGATIVE
Urine Glucose: NEGATIVE
Urobilinogen, UA: 0.2 (ref 0.0–1.0)
pH: 5.5 (ref 5.0–8.0)

## 2018-06-02 LAB — COMPREHENSIVE METABOLIC PANEL
ALT: 36 U/L (ref 0–53)
AST: 21 U/L (ref 0–37)
Albumin: 4.9 g/dL (ref 3.5–5.2)
Alkaline Phosphatase: 66 U/L (ref 39–117)
BUN: 19 mg/dL (ref 6–23)
CO2: 29 mEq/L (ref 19–32)
CREATININE: 1.04 mg/dL (ref 0.40–1.50)
Calcium: 9.6 mg/dL (ref 8.4–10.5)
Chloride: 100 mEq/L (ref 96–112)
GFR: 94.38 mL/min (ref >60.00)
Glucose, Bld: 90 mg/dL (ref 70–99)
Potassium: 4.5 mEq/L (ref 3.5–5.1)
SODIUM: 137 meq/L (ref 135–145)
Total Bilirubin: 0.5 mg/dL (ref 0.2–1.2)
Total Protein: 7 g/dL (ref 6.0–8.3)

## 2018-06-05 ENCOUNTER — Other Ambulatory Visit: Payer: Self-pay

## 2018-06-05 DIAGNOSIS — R319 Hematuria, unspecified: Secondary | ICD-10-CM

## 2018-06-12 ENCOUNTER — Telehealth: Payer: Self-pay | Admitting: Family Medicine

## 2018-06-12 MED ORDER — ATORVASTATIN CALCIUM 40 MG PO TABS: 40.0000 mg | ORAL_TABLET | ORAL | 3 refills | Status: DC

## 2018-06-12 MED ORDER — LISINOPRIL 10 MG PO TABS: 10.0000 mg | ORAL_TABLET | Freq: Every day | ORAL | 1 refills | Status: DC

## 2018-06-12 NOTE — Telephone Encounter (Signed)
Patient called regarding results.  Information given to patient.  Follow up appointments made and atorvastatin rx sent to pharmacy.

## 2018-06-15 ENCOUNTER — Other Ambulatory Visit (INDEPENDENT_AMBULATORY_CARE_PROVIDER_SITE_OTHER): Payer: 59

## 2018-06-15 ENCOUNTER — Other Ambulatory Visit: Payer: Self-pay

## 2018-06-15 DIAGNOSIS — R319 Hematuria, unspecified: Secondary | ICD-10-CM

## 2018-06-15 LAB — URINALYSIS, ROUTINE W REFLEX MICROSCOPIC
Bilirubin Urine: NEGATIVE
Ketones, ur: NEGATIVE
Leukocytes,Ua: NEGATIVE
Nitrite: NEGATIVE
Specific Gravity, Urine: 1.025 (ref 1.000–1.030)
Urine Glucose: NEGATIVE
Urobilinogen, UA: 0.2 (ref 0.0–1.0)
pH: 6 (ref 5.0–8.0)

## 2018-11-21 ENCOUNTER — Other Ambulatory Visit (INDEPENDENT_AMBULATORY_CARE_PROVIDER_SITE_OTHER): Payer: 59

## 2018-11-21 ENCOUNTER — Other Ambulatory Visit: Payer: Self-pay

## 2018-11-21 DIAGNOSIS — E785 Hyperlipidemia, unspecified: Secondary | ICD-10-CM

## 2018-11-21 DIAGNOSIS — I10 Essential (primary) hypertension: Secondary | ICD-10-CM

## 2018-11-21 LAB — COMPREHENSIVE METABOLIC PANEL
ALT: 43 U/L (ref 0–53)
AST: 20 U/L (ref 0–37)
Albumin: 4.6 g/dL (ref 3.5–5.2)
Alkaline Phosphatase: 58 U/L (ref 39–117)
BUN: 14 mg/dL (ref 6–23)
CO2: 29 mEq/L (ref 19–32)
Calcium: 9.4 mg/dL (ref 8.4–10.5)
Chloride: 103 mEq/L (ref 96–112)
Creatinine, Ser: 1.13 mg/dL (ref 0.40–1.50)
GFR: 85.57 mL/min (ref >60.00)
Glucose, Bld: 95 mg/dL (ref 70–99)
Potassium: 4.3 mEq/L (ref 3.5–5.1)
Sodium: 139 mEq/L (ref 135–145)
Total Bilirubin: 0.5 mg/dL (ref 0.2–1.2)
Total Protein: 6.7 g/dL (ref 6.0–8.3)

## 2018-11-21 LAB — LDL CHOLESTEROL, DIRECT: Direct LDL: 144 mg/dL

## 2018-11-21 NOTE — Progress Notes (Signed)
Phone [phone]   Subjective:  Daryl Pace is a 43 y.o. year old very pleasant male patient who presents for/with See problem oriented charting Chief Complaint  Patient presents with  . Hypertension   ROS- Denies HA, dizziness, CP, SOB, visual changes.   Past Medical History-  Patient Active Problem List   Diagnosis Date Noted  . Current smoker 11/20/2014    Priority: High  . Eczema 05/22/2018    Priority: Medium  . GAD (generalized anxiety disorder) 10/01/2017    Priority: Medium  . Hyperlipidemia 03/06/2015    Priority: Medium  . Hypertension     Priority: Medium  . 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 Atypical chest pain 07/10/2015    Medications- reviewed and updated Current Outpatient Medications  Medication Sig Dispense Refill  . atorvastatin (LIPITOR) 40 MG tablet Take 1 tablet (40 mg total) by mouth once a week. 13 tablet 3  . escitalopram (LEXAPRO) 10 MG tablet Take 1 tablet (10 mg total) by mouth daily. 90 tablet 1  . lisinopril (PRINIVIL,ZESTRIL) 10 MG tablet Take 1 tablet (10 mg total) by mouth daily. 90 tablet 1  . triamcinolone cream (KENALOG) 0.1 % Apply 1 application topically 2 (two) times daily. For 7-10 days maximum 80 g 2   No current facility-administered medications for this visit.      Objective:  BP 130/80 (BP Location: Left Arm, Patient Position: Sitting, Cuff Size: Normal)   Pulse 86   Temp 98.2 F (36.8 C) (Oral)   Ht 6' (1.829 m)   Wt 199 lb 12.8 oz (90.6 kg)   SpO2 98%   BMI 27.10 kg/m  Gen: NAD, resting comfortably CV: RRR no murmurs rubs or gallops No obvious tissue enlargement behind right nipple today.  Lungs: CTAB no crackles, wheeze, rhonchi Abdomen: soft/nontender/nondistended/normal bowel sounds.  Ext: no edema Skin: warm, dry     Assessment and Plan   #hypertension S: controlled on Taking Lisinopril 10 mg daily. Checking BP at home occasionally. Staying consistently below 140/90.  Eating  salt in moderation. Frozen foods in moderation. No structured exercise. Walks at work daily.  BP Readings from Last 3 Encounters:  11/22/18 130/80  05/22/18 120/60  11/04/17 118/78  A/P: Stable. Continue current medications.   #hyperlipidemia/overweight S:  controlled on Atorvastatin 40 mg once weekly. Tolerating well. Weight up some over last year but has lost 3 lbs from last visit- he has tried over last month and a half to not eat after 8 pm helping him cutting down on calories. 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   # Anxiety S:Taking Escitalopram 10 gm daily.  Has not been taking daily as prescribed.  A/P: Stable. Continue current medications.    # Tingling around R Nipple S: right breast lump 08/31/16 advised ultrasound- patient never completed. He got phone call but ended up forgetting to do this. Knot sensation has improved but  gets some tingling around right nipple still A/P: Patient has option for mammogram at work potentially. With knot behind right nipple 2 years ago and now tingling sensation I think mammogram is reasonable. We had previously considered ultrasound but could refer to breast center for mammogram if they will not do it at work- he will reach out to me if they will not complete at work of if test is abnormal at work   # Smoker S:3 -4 cigarettes a day at present.   A/P: strongly encouraged cessation- he will consider  - declines flu  shot for now for this season but seems more open  Recommended follow up: 6 month follow up  Lab/Order associations: future labs ordered- will be fasting   ICD-10-CM   1. Essential hypertension  I10 CBC    Comprehensive metabolic panel    Lipid panel    Urinalysis  2. Hyperlipidemia, unspecified hyperlipidemia type  E78.5   3. GAD (generalized anxiety disorder)  F41.1   4. Current smoker  F17.200 Urinalysis  5. Overweight  E66.3   6. Preventative health care  Z00.00 CBC    Comprehensive metabolic panel    Lipid panel    Urinalysis  7. Plantar fibromatosis  M72.2    Return precautions advised.  [name], MD

## 2018-11-21 NOTE — Patient Instructions (Addendum)
Cholesterol has improved- recheck at physical, continue to work on healthy eating, regular exercise as not quite at goal  Patient has option for mammogram at work potentially. With knot behind right nipple 2 years ago and now tingling sensation I think mammogram is reasonable. We had previously considered ultrasound but could refer to breast center for mammogram if they will not do it at work- he will reach out to me if they will not complete at work of if test is abnormal at work   As always- would love to see you quit smoking before follow up  Plantar fibromatosis is name of what you have

## 2018-11-22 ENCOUNTER — Encounter: Payer: Self-pay | Admitting: Family Medicine

## 2018-11-22 ENCOUNTER — Ambulatory Visit: Payer: 59 | Admitting: Family Medicine

## 2018-11-22 VITALS — BP 130/80 | HR 86 | Temp 98.2°F | Ht 72.0 in | Wt 199.8 lb

## 2018-11-22 DIAGNOSIS — Z0000 Encounter for general adult medical examination without abnormal findings: Secondary | ICD-10-CM

## 2018-11-22 DIAGNOSIS — E785 Hyperlipidemia, unspecified: Secondary | ICD-10-CM | POA: Diagnosis not present

## 2018-11-22 DIAGNOSIS — F17200 Nicotine dependence, unspecified, uncomplicated: Secondary | ICD-10-CM

## 2018-11-22 DIAGNOSIS — I10 Essential (primary) hypertension: Secondary | ICD-10-CM

## 2018-11-22 DIAGNOSIS — E663 Overweight: Secondary | ICD-10-CM

## 2018-11-22 DIAGNOSIS — F411 Generalized anxiety disorder: Secondary | ICD-10-CM

## 2018-11-22 DIAGNOSIS — M722 Plantar fascial fibromatosis: Secondary | ICD-10-CM

## 2018-11-30 ENCOUNTER — Encounter: Payer: Self-pay | Admitting: Family Medicine

## 2018-11-30 DIAGNOSIS — N63 Unspecified lump in unspecified breast: Secondary | ICD-10-CM

## 2018-12-01 ENCOUNTER — Other Ambulatory Visit: Payer: Self-pay | Admitting: Family Medicine

## 2018-12-01 DIAGNOSIS — N63 Unspecified lump in unspecified breast: Secondary | ICD-10-CM

## 2018-12-01 NOTE — Telephone Encounter (Signed)
Mammogram has been placed. Pt notified via detailed vm.

## 2018-12-13 ENCOUNTER — Other Ambulatory Visit: Payer: Self-pay | Admitting: Family Medicine

## 2018-12-14 ENCOUNTER — Ambulatory Visit
Admission: RE | Admit: 2018-12-14 | Discharge: 2018-12-14 | Disposition: A | Discharge status: Home / Self Care | Payer: 59 | Source: Ambulatory Visit | Attending: Family Medicine | Admitting: Family Medicine

## 2018-12-14 ENCOUNTER — Other Ambulatory Visit: Payer: Self-pay

## 2018-12-14 ENCOUNTER — Other Ambulatory Visit: Payer: 59

## 2018-12-14 ENCOUNTER — Ambulatory Visit: Payer: 59

## 2018-12-14 DIAGNOSIS — N63 Unspecified lump in unspecified breast: Secondary | ICD-10-CM

## 2019-05-18 ENCOUNTER — Other Ambulatory Visit: Payer: 59

## 2019-05-22 ENCOUNTER — Other Ambulatory Visit: Payer: Self-pay

## 2019-05-22 NOTE — Progress Notes (Signed)
Phone: [phone]    Subjective:  Patient presents today for their annual physical. Chief complaint-noted.   See problem oriented charting- Review of Systems  Constitutional: Negative for chills and fever.  HENT: Negative for ear pain, hearing loss and tinnitus.   Eyes: Negative for blurred vision, double vision and photophobia.  Respiratory: Negative for cough, hemoptysis, shortness of breath and wheezing.   Cardiovascular: Negative for chest pain, palpitations and leg swelling.  Gastrointestinal: Negative for heartburn, nausea and vomiting.  Genitourinary: Negative for dysuria, frequency and urgency.  Musculoskeletal: Negative for back pain, joint pain, myalgias and neck pain.  Skin: Negative for itching and rash.  Neurological: Negative for dizziness, weakness and headaches.  Endo/Heme/Allergies: Does not bruise/bleed easily.  Psychiatric/Behavioral: Negative for depression, hallucinations, memory loss and suicidal ideas.   The following were reviewed and entered/updated in epic: Past Medical History:  Diagnosis Date  . Chicken pox   . Hypertension    diagnosed at work screenings  . Kidney stone    around age 11, peed them out   Patient Active Problem List   Diagnosis Date Noted  . Current smoker 11/20/2014    Priority: High  . Eczema 05/22/2018    Priority: Medium  . GAD (generalized anxiety disorder) 10/01/2017    Priority: Medium  . Hyperlipidemia 03/06/2015    Priority: Medium  . Hypertension     Priority: Medium  . 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 Atypical chest pain 07/10/2015   Past Surgical History:  Procedure Laterality Date  . HERNIA REPAIR     around 25, bilateral    Family History  Problem Relation Age of Onset  . Stroke Mother        early 34s, drop foot  . Hypertension Mother   . Breast cancer Maternal Aunt     Medications- reviewed and updated Current Outpatient Medications  Medication Sig Dispense Refill  .  atorvastatin (LIPITOR) 40 MG tablet Take 1 tablet (40 mg total) by mouth once a week. 13 tablet 3  . escitalopram (LEXAPRO) 10 MG tablet Take 1 tablet (10 mg total) by mouth daily. 90 tablet 1  . lisinopril (ZESTRIL) 10 MG tablet TAKE 1 TABLET BY MOUTH EVERY DAY 90 tablet 1  . triamcinolone cream (KENALOG) 0.1 % Apply 1 application topically 2 (two) times daily. For 7-10 days maximum 80 g 2   No current facility-administered medications for this visit.    Allergies-reviewed and updated No Known Allergies  Social History   Social History Narrative   Family: Single. Girlfriend and 3 kids. 4 people in home. Son that lives in [place]- good situation with former partner.       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       Hobbies: football, basketball with [place] at park, bowl at the park      Objective:  BP 128/78   Pulse 93   Temp 97.6 F (36.4 C) (Temporal)   Ht 6' (1.829 m)   Wt 200 lb 3.2 oz (90.8 kg)   SpO2 99%   BMI 27.15 kg/m  Gen: NAD, resting comfortably HEENT: Mask not removed due to covid 19. TM normal. Bridge of nose normal. Eyelids normal.  Neck: no thyromegaly or cervical lymphadenopathy  CV: RRR no murmurs rubs or gallops Lungs: CTAB no crackles, wheeze, rhonchi Abdomen: soft/nontender/nondistended/normal bowel  sounds. No rebound or guarding.  Ext: no edema Skin: warm, dry, hyperpigmented patches on upper back  Neuro: grossly normal, moves all extremities, PERRLA    Assessment and Plan:  44 y.o. male presenting for annual physical.  Health Maintenance counseling: 1. Anticipatory guidance: Patient counseled regarding regular dental exams has not been in over a year dentist has retired looking into new one. Given local dentist list, eye exams never had- no issues ,  avoiding smoking and second hand smoke current smoker - needs to quit ,  limiting alcohol to 2 beverages per day. 1-2 beers occasionally on the weekend.    2. Risk factor reduction:  Advised patient of need for regular exercise and diet rich and fruits and vegetables to reduce risk of heart attack and stroke. Exercise- very active at work- encouraged to get 150 minutes outside of work . Diet-tries to eat healthy diet. Lots of vegetables.  We discussed less processed food and more whole food plant based diet Wt Readings from Last 3 Encounters:  05/23/19 200 lb 3.2 oz (90.8 kg)  11/22/18 199 lb 12.8 oz (90.6 kg)  05/22/18 202 lb 3.2 oz (91.7 kg)  3. Immunizations/screenings/ancillary studies Immunization History  Administered Date(s) Administered  . Tdap 11/20/2014  4. Prostate cancer screening- no family history, start at age 40  5. Colon cancer screening - no family history, start at age 72 6. Skin cancer screening/prevention- advised regular sunscreen use. . Would like referral to dermatology.  7. Testicular cancer screening- advised monthly self exams  8. STD screening- patient opts out as monogamous with girlfriend 27. Current  smoker- Smokes 1 ppw for 15 years . Will get UA with labs  Status of chronic or acute concerns   # hypertension S:controlled on Taking Lisinopril 10 mg daily. A/P: Stable. Continue current medications.   # Rash S:patient with continued hyperpigmented on upper back.He is using a back scrub exfoliator that allows him to scrub whole back but felt like areas worsened/spread on this into his axilla. In his axilla there is some scaling- otherwise areas are smooth but dark. They do not itch.   Uses eucerin or similar once a day or neosporin eczema cream. He also has tried triamcinolone and doesn't really help.   A/P:  Unclear cause of ongoing rash- will refer to dermatology. Had thought eczema but even with treatment continuing to get new hyperpigmented patches.    # Current smoker S: 1/4 PPD if that- sometimes less if working. Doesn't  smoke at work.   A/P: strongly encouraged cessation- he is not ready to quit but asks me to continue to encourage him   # GAD S:still gets intermittent mild butterfly in chest sensation but feels like reasonable well controlled.    He has come off lexapro- last refill was 2019 with 6 months worth.  A/P: reasonable control even without medicine. He wonders if he could have further improvement on meds but since he feels reasonably well wants to stay off for now- if things worsen he agrees to reach out and I would send in lower dose 5 mg for him to try per his preference   #hyperlipidemia S: compliant with atorvastatin 58m once a week Lab Results  Component Value Date   CHOL 258 (H) 06/02/2018   HDL 38.70 (L) 06/02/2018   LDLCALC 188 (H) 03/06/2015   LDLDIRECT 144.0 11/21/2018   TRIG 254.0 (H) 06/02/2018   CHOLHDL 7 06/02/2018   A/P: we will target LDL under 100 with lifestyle  changes- discussed nutritionfacts.org in addition to continued low dose cholesterol medicine  Recommended follow up: Return in about 6 months (around 11/23/2019) for follow up- or sooner if needed.  Lab/Order associations: fasting   ICD-10-CM   1. Preventative health care  Z00.00 CBC with Differential/Platelet    Comprehensive metabolic panel    Lipid panel  2. Current smoker  F17.200   3. Essential hypertension  I10 CBC with Differential/Platelet    Comprehensive metabolic panel    Lipid panel  4. Hyperlipidemia, unspecified hyperlipidemia type  E78.5 CBC with Differential/Platelet    Comprehensive metabolic panel    Lipid panel  5. GAD (generalized anxiety disorder)  F41.1   6. Rash  R21 Ambulatory referral to Dermatology    No orders of the defined types were placed in this encounter.   Return precautions advised.   [name], MD

## 2019-05-23 ENCOUNTER — Encounter: Payer: 59 | Admitting: Family Medicine

## 2019-05-23 ENCOUNTER — Ambulatory Visit (INDEPENDENT_AMBULATORY_CARE_PROVIDER_SITE_OTHER): Payer: 59 | Admitting: Family Medicine

## 2019-05-23 ENCOUNTER — Encounter: Payer: Self-pay | Admitting: Family Medicine

## 2019-05-23 VITALS — BP 128/78 | HR 93 | Temp 97.6°F | Ht 72.0 in | Wt 200.2 lb

## 2019-05-23 DIAGNOSIS — E785 Hyperlipidemia, unspecified: Secondary | ICD-10-CM | POA: Diagnosis not present

## 2019-05-23 DIAGNOSIS — F17200 Nicotine dependence, unspecified, uncomplicated: Secondary | ICD-10-CM

## 2019-05-23 DIAGNOSIS — Z0000 Encounter for general adult medical examination without abnormal findings: Secondary | ICD-10-CM | POA: Diagnosis not present

## 2019-05-23 DIAGNOSIS — I10 Essential (primary) hypertension: Secondary | ICD-10-CM

## 2019-05-23 DIAGNOSIS — F411 Generalized anxiety disorder: Secondary | ICD-10-CM

## 2019-05-23 DIAGNOSIS — R21 Rash and other nonspecific skin eruption: Secondary | ICD-10-CM

## 2019-05-23 LAB — URINALYSIS
Bilirubin Urine: NEGATIVE
Hgb urine dipstick: NEGATIVE
Ketones, ur: NEGATIVE
Leukocytes,Ua: NEGATIVE
Nitrite: NEGATIVE
Specific Gravity, Urine: >=1.03 — AB (ref 1.000–1.030)
Total Protein, Urine: NEGATIVE
Urine Glucose: NEGATIVE
Urobilinogen, UA: 0.2 (ref 0.0–1.0)
pH: 6 (ref 5.0–8.0)

## 2019-05-23 LAB — CBC WITH DIFFERENTIAL/PLATELET
Basophils Absolute: 0 10*3/uL (ref 0.0–0.1)
Basophils Relative: 0.8 % (ref 0.0–3.0)
Eosinophils Absolute: 0.1 10*3/uL (ref 0.0–0.7)
Eosinophils Relative: 2.3 % (ref 0.0–5.0)
HCT: 47.8 % (ref 39.0–52.0)
Hemoglobin: 16 g/dL (ref 13.0–17.0)
Lymphocytes Relative: 26.1 % (ref 12.0–46.0)
Lymphs Abs: 1.5 10*3/uL (ref 0.7–4.0)
MCHC: 33.5 g/dL (ref 30.0–36.0)
MCV: 86 fl (ref 78.0–100.0)
Monocytes Absolute: 0.6 10*3/uL (ref 0.1–1.0)
Monocytes Relative: 11 % (ref 3.0–12.0)
Neutro Abs: 3.5 10*3/uL (ref 1.4–7.7)
Neutrophils Relative %: 59.8 % (ref 43.0–77.0)
Platelets: 327 10*3/uL (ref 150.0–400.0)
RBC: 5.56 Mil/uL (ref 4.22–5.81)
RDW: 14.3 % (ref 11.5–15.5)
WBC: 5.9 10*3/uL (ref 4.0–10.5)

## 2019-05-23 LAB — LIPID PANEL
Cholesterol: 213 mg/dL — ABNORMAL HIGH (ref 0–200)
HDL: 34.7 mg/dL — ABNORMAL LOW (ref >39.00)
LDL Cholesterol: 147 mg/dL — ABNORMAL HIGH (ref 0–99)
NonHDL: 178.4
Total CHOL/HDL Ratio: 6
Triglycerides: 158 mg/dL — ABNORMAL HIGH (ref 0.0–149.0)
VLDL: 31.6 mg/dL (ref 0.0–40.0)

## 2019-05-23 LAB — COMPREHENSIVE METABOLIC PANEL
ALT: 37 U/L (ref 0–53)
AST: 16 U/L (ref 0–37)
Albumin: 4.6 g/dL (ref 3.5–5.2)
Alkaline Phosphatase: 62 U/L (ref 39–117)
BUN: 14 mg/dL (ref 6–23)
CO2: 30 mEq/L (ref 19–32)
Calcium: 10 mg/dL (ref 8.4–10.5)
Chloride: 103 mEq/L (ref 96–112)
Creatinine, Ser: 1.16 mg/dL (ref 0.40–1.50)
GFR: 82.83 mL/min (ref >60.00)
Glucose, Bld: 103 mg/dL — ABNORMAL HIGH (ref 70–99)
Potassium: 4.8 mEq/L (ref 3.5–5.1)
Sodium: 140 mEq/L (ref 135–145)
Total Bilirubin: 0.6 mg/dL (ref 0.2–1.2)
Total Protein: 6.9 g/dL (ref 6.0–8.3)

## 2019-05-23 MED ORDER — ATORVASTATIN CALCIUM 40 MG PO TABS: 40.0000 mg | ORAL_TABLET | ORAL | 3 refills | Status: DC

## 2019-05-23 MED ORDER — LISINOPRIL 10 MG PO TABS: 10.0000 mg | ORAL_TABLET | Freq: Every day | ORAL | 3 refills | Status: DC

## 2019-05-23 NOTE — Patient Instructions (Addendum)
Please stop by lab before you go- release urine test from september If you do not have mychart- we will call you about results within 5 business days of [place] receiving them.  If you have mychart- we will send your results within 3 business days of [place] receiving them.  If abnormal or we want to clarify a result, we will call or mychart you to make sure you receive the message.  If you have questions or concerns or don't hear within 5 business days, please send [place] a message or call us.   We will call you within two weeks about your referral to dermatology. If you do not hear within 3 weeks, give [place] a call.    NutritionFacts.org Look up info on cholesterol- the more plant based the diet is- the better off you will be in regards to cholesterol  As always one of the best things you can do for your health is quit smoking. Consider calling 1800quitnow  Recommended follow up: Return in about 6 months (around 11/23/2019) for follow up- or sooner if needed.

## 2019-05-23 NOTE — Addendum Note (Signed)
Addended by: Christiana Fuchs on: 05/23/2019 08:50 AM   Modules accepted: Orders

## 2019-05-23 NOTE — Assessment & Plan Note (Signed)
S:still gets intermittent mild butterfly in chest sensation but feels like reasonable well controlled.    He has come off lexapro- last refill was 2019 with 6 months worth.  A/P: reasonable control even without medicine. He wonders if he could have further improvement on meds but since he feels reasonably well wants to stay off for now- if things worsen he agrees to reach out and I would send in lower dose 5 mg for him to try per his preference

## 2019-05-31 ENCOUNTER — Other Ambulatory Visit: Payer: Self-pay | Admitting: Family Medicine

## 2019-06-21 ENCOUNTER — Encounter: Payer: Self-pay | Admitting: Family Medicine

## 2019-07-26 ENCOUNTER — Other Ambulatory Visit: Payer: Self-pay

## 2019-07-26 ENCOUNTER — Encounter: Payer: Self-pay | Admitting: Physician Assistant

## 2019-07-26 ENCOUNTER — Ambulatory Visit: Payer: 59 | Admitting: Physician Assistant

## 2019-07-26 DIAGNOSIS — L309 Dermatitis, unspecified: Secondary | ICD-10-CM

## 2019-07-26 LAB — POCT SKIN KOH

## 2019-07-26 MED ORDER — CLOBETASOL PROPIONATE 0.05 % EX CREA: 1.0000 "application " | TOPICAL_CREAM | Freq: Two times a day (BID) | CUTANEOUS | 0 refills | Status: DC

## 2019-07-26 NOTE — Progress Notes (Signed)
   New Patient Visit  Subjective  Daryl Pace is a 44 y.o. AA male who presents for the following: Rash (back x months-comes and goes tx- TAC cream & gold powder cream).  Pt has had a long history of eczema on his back for years. It itches and PCP gave Triamcinolone and it helps subdue it. After he really scrubbed his back with a loofah he noticed that the spots were really red. Now his back has a lot of spots. He wonders if he also has allergies and may be allergic to dyes or something with fabric. 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 He is also using gold bond eczema cream and mixing the Triamcinolone and gold bond together.   Objective  Well appearing patient in no apparent distress; mood and affect are within normal limits.  All skin waist up examined. No suspicious moles noted on back.  Objective  Left Upper Back: Scaling patches and some hyperpigmented patches scattered on back.  Assessment & Plan  Dermatitis Left Upper Back  KOH   Other Related Procedures POCT Skin KOH  Ordered Medications: clobetasol cream (TEMOVATE) 0.05 %

## 2019-08-23 ENCOUNTER — Ambulatory Visit: Payer: 59 | Admitting: Physician Assistant

## 2019-11-21 ENCOUNTER — Telehealth: Payer: Self-pay

## 2019-11-21 NOTE — Telephone Encounter (Signed)
Pt is getting covid vaccine today at 2 and is nervous. He wants to make sure there is nothing in his chart that would make him not eligible for the vaccine. Please call

## 2019-11-21 NOTE — Telephone Encounter (Signed)
Please advise 

## 2019-11-26 IMAGING — CR DG CHEST 2V
2 series · 2 of 2 positions shown · non-contrast
Comparison: 06/29/2015 chest radiographs.

CLINICAL DATA: 42-year-old male with left chest and shoulder pain
onset today. Similar prior episode 18 months ago. Smoker.

EXAM:
CHEST - 2 VIEW

[w chest pa]
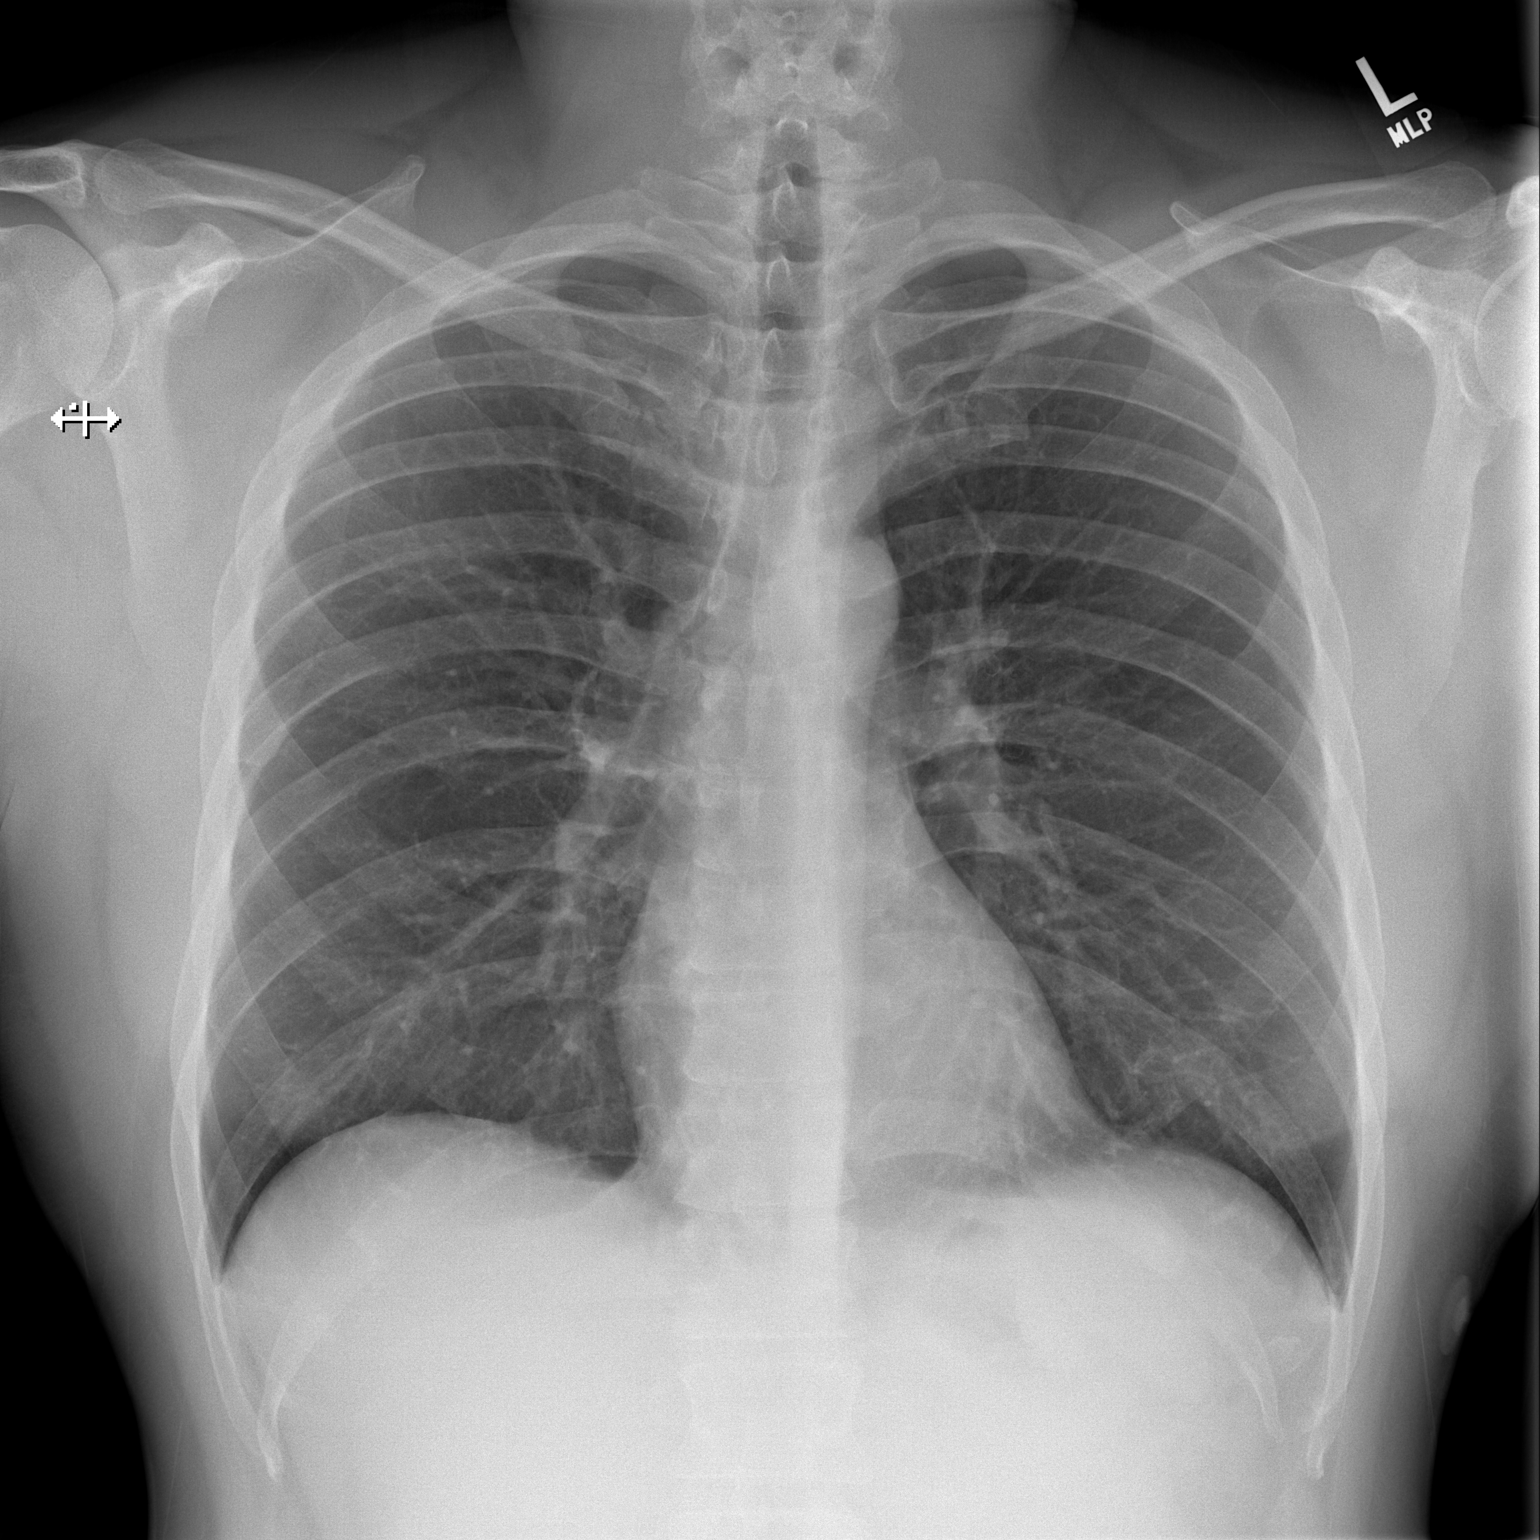

[w chest lat]
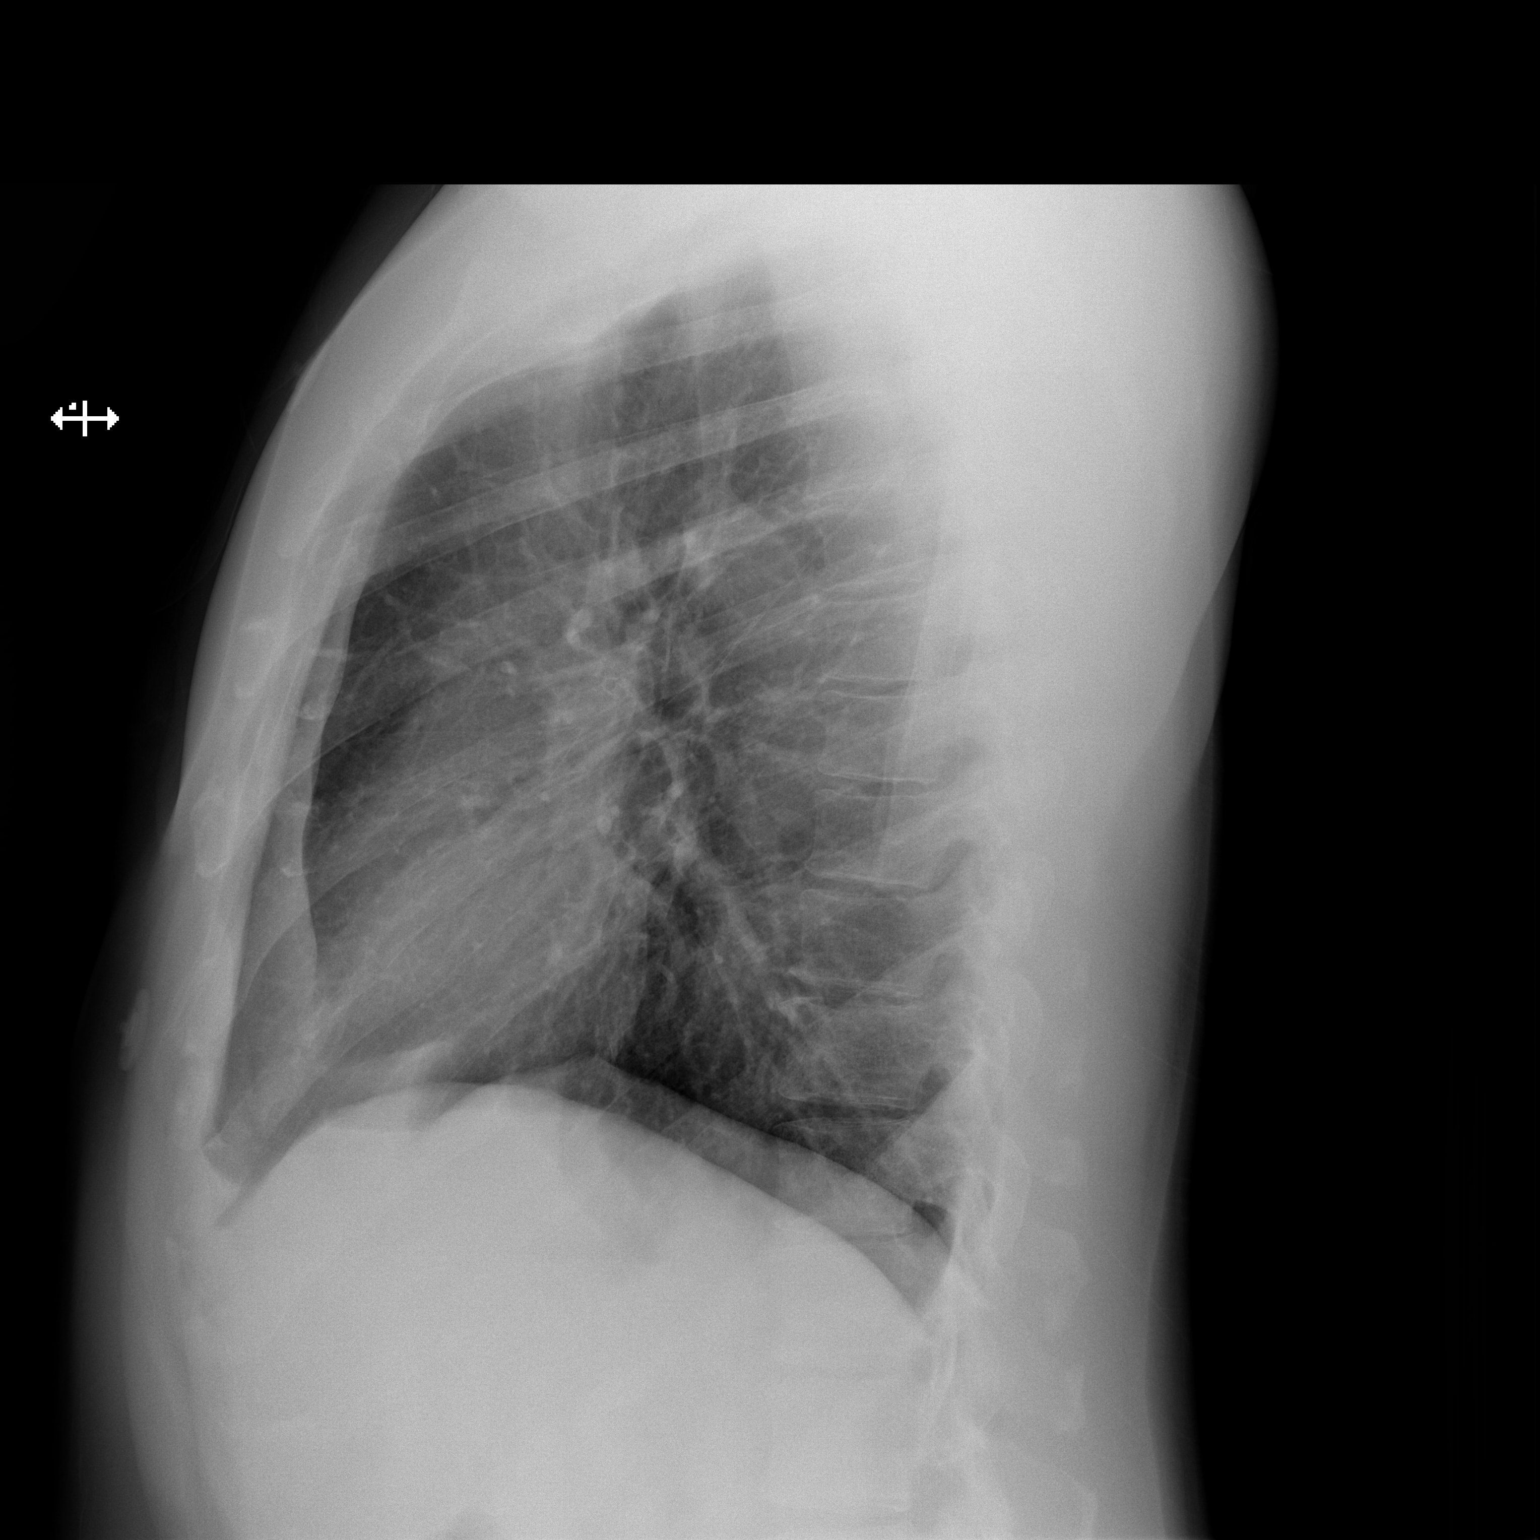

[2 of 2 positions shown; findings below may reference images not displayed]

FINDINGS: Lung volumes and mediastinal contours remain normal. Visualized
tracheal air column is within normal limits. The lung parenchyma
appears stable and clear. No pneumothorax or pleural effusion.
Stable and negative visible osseous structures. Paucity of bowel gas
in the upper abdomen.
IMPRESSION: Stable and negative.  No acute cardiopulmonary abnormality.

## 2019-11-27 NOTE — Progress Notes (Signed)
Phone [phone] In person visit   Subjective:   Daryl Pace is a 44 y.o. year old very pleasant male patient who presents for/with See problem oriented charting Chief Complaint  Patient presents with  . Hypertension    This visit occurred during the SARS-CoV-2 public health emergency.  Safety protocols were in place, including screening questions prior to the visit, additional usage of staff PPE, and extensive cleaning of exam room while observing appropriate contact time as indicated for disinfecting solutions.   Past Medical History-  Patient Active Problem List   Diagnosis Date Noted  . Current smoker 11/20/2014    Priority: High  . Eczema 05/22/2018    Priority: Medium  . GAD (generalized anxiety disorder) 10/01/2017    Priority: Medium  . Hyperlipidemia 03/06/2015    Priority: Medium  . Hypertension     Priority: Medium  . 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 Atypical chest pain 07/10/2015    Medications- reviewed and updated Current Outpatient Medications  Medication Sig Dispense Refill  . atorvastatin (LIPITOR) 40 MG tablet TAKE 1 TABLET BY MOUTH ONCE A WEEK 12 tablet 4  . clobetasol cream (TEMOVATE) 0.97 % Apply 1 application topically 2 (two) times daily. 60 g 0  . lisinopril (ZESTRIL) 10 MG tablet Take 1 tablet (10 mg total) by mouth daily. 90 tablet 3  . triamcinolone cream (KENALOG) 0.1 % Apply 1 application topically 2 (two) times daily. For 7-10 days maximum 80 g 2   No current facility-administered medications for this visit.     Objective:  BP 128/74   Pulse 69   Temp (!) 97.5 F (36.4 C) (Temporal)   Ht 6' (1.829 m)   Wt 196 lb (88.9 kg)   SpO2 99%   BMI 26.58 kg/m  Gen: NAD, resting comfortably CV: RRR no murmurs rubs or gallops Lungs: CTAB no crackles, wheeze, rhonchi Ext: no edema Skin: warm, dry    Assessment and Plan   # smoker- 1/4 PPD. Strongly encouraged cessation. Not quite ready to quit.  -declines flu  shot despite this. Did get first moderna immunization.   #hypertension S: medication: lisinopril 10Mg Home readings #s:  home readings have been on average 120/80 very rarely may have high of 130/90.   BP Readings from Last 3 Encounters:  11/29/19 128/74  05/23/19 128/78  11/22/18 130/80  A/P: Stable. Continue current medications.   # GAD S:Medication: not currently taking any medications for anxiety. Did have panic attack last weekend with a haircut and was concerning for him. On lexapro in 2019 and was helpful  Work schedule switching nights to days likely increases anxiety  Counseling:  not receiving counseling at this time.  GAD 7 : Generalized Anxiety Score 11/22/2018 05/22/2018 11/04/2017 11/04/2017  Nervous, Anxious, on Edge 1 1 1 1   Control/stop worrying 0 1 1 1   Worry too much - different things 1 1 1 1   Trouble relaxing 0 1 0 0  Restless 0 0 0 0  Easily annoyed or irritable 0 0 0 0  Afraid - awful might happen 0 1 1 1   Total GAD 7 Score 2 5 4 4   Anxiety Difficulty Not difficult at all Somewhat difficult - Somewhat difficult  A/P: with ongoing anxiety and wanting to avoid lexapro (states it limits range of emotions) recommended counseling   #hyperlipidemia S: Medication: atorvastatin 40Mg weekly. States has extra bottles of pills  Doing some walking. Schedule goes back and forth night to day.  Lab Results  Component Value Date   CHOL 213 (H) 05/23/2019   HDL 34.70 (L) 05/23/2019   LDLCALC 147 (H) 05/23/2019   LDLDIRECT 144.0 11/21/2018   TRIG 158.0 (H) 05/23/2019   CHOLHDL 6 05/23/2019   A/P: encouraged healthy eating and regular exercise and if not improved by follow up consider twice a week or every other day for atorvastatin   # dermatitis/eczema S: On his back. Was seen by dermatology (diagnosed as eczema or dermatitis- had scraping determining not fungal) and has improved with stronger cream and change in detergent. Would like to get refill on the Clobetasol cream  that has been given by dermatology in the past. Free and clear products more helpful  Missed follow up due to daughter having covid A/P: I did refill medicine- glad improved- but encouraged dermatology follow up   Recommended follow up: 6 month physical   Lab/Order associations:   ICD-10-CM   1. Essential hypertension  I10   2. Hyperlipidemia, unspecified hyperlipidemia type  E78.5   3. GAD (generalized anxiety disorder)  F41.1   4. Dermatitis  L30.9 clobetasol cream (TEMOVATE) 0.05 %    DISCONTINUED: clobetasol cream (TEMOVATE) 0.05 %    Meds ordered this encounter  Medications  . clobetasol cream (TEMOVATE) 0.05 %    Sig: Apply 1 application topically 2 (two) times daily. For 7-10 days maximum for eczema (originally diagnosed by dermatology)    Dispense:  60 g    Refill:  3    Return precautions advised.  [name], MD

## 2019-11-27 NOTE — Patient Instructions (Addendum)
Health Maintenance Due  Topic Date Due  . INFLUENZA VACCINE -please let us know when you have gotten this Never done   Please call [phone] to schedule a visit with [facility] -Trey Paula is an excellent counselor who is based out of our clinic  Please strongly encourage quit smoing  Lets try to increase fruits/veggies and decrease meats/cheeses/etc and if you do have them chicken or fish are better choices.

## 2019-11-29 ENCOUNTER — Other Ambulatory Visit: Payer: Self-pay

## 2019-11-29 ENCOUNTER — Ambulatory Visit: Payer: 59 | Admitting: Family Medicine

## 2019-11-29 ENCOUNTER — Encounter: Payer: Self-pay | Admitting: Family Medicine

## 2019-11-29 VITALS — BP 128/74 | HR 69 | Temp 97.5°F | Ht 72.0 in | Wt 196.0 lb

## 2019-11-29 DIAGNOSIS — E785 Hyperlipidemia, unspecified: Secondary | ICD-10-CM

## 2019-11-29 DIAGNOSIS — F411 Generalized anxiety disorder: Secondary | ICD-10-CM

## 2019-11-29 DIAGNOSIS — L309 Dermatitis, unspecified: Secondary | ICD-10-CM | POA: Diagnosis not present

## 2019-11-29 DIAGNOSIS — I10 Essential (primary) hypertension: Secondary | ICD-10-CM | POA: Diagnosis not present

## 2019-11-29 MED ORDER — CLOBETASOL PROPIONATE 0.05 % EX CREA: 1.0000 "application " | TOPICAL_CREAM | Freq: Two times a day (BID) | CUTANEOUS | 0 refills | Status: DC

## 2019-11-29 MED ORDER — CLOBETASOL PROPIONATE 0.05 % EX CREA: 1.0000 "application " | TOPICAL_CREAM | Freq: Two times a day (BID) | CUTANEOUS | 3 refills | Status: DC

## 2019-11-29 MED ORDER — TRIAMCINOLONE ACETONIDE 0.1 % EX CREA: 1.0000 "application " | TOPICAL_CREAM | Freq: Two times a day (BID) | CUTANEOUS | 2 refills | Status: DC

## 2020-05-27 ENCOUNTER — Other Ambulatory Visit: Payer: Self-pay | Admitting: Family Medicine

## 2020-05-29 NOTE — Progress Notes (Signed)
Phone: [phone]    Subjective:  Patient presents today for their annual physical. Chief complaint-noted.   See problem oriented charting- ROS- full  review of systems was completed and negative  except for: eye redness- thinks related to job going back and forth between days and nights. Has noted anxiety better with deep breathing  The following were reviewed and entered/updated in epic: Past Medical History:  Diagnosis Date  . Chicken pox   . Hypertension    diagnosed at work screenings  . Kidney stone    around age 32, peed them out   Patient Active Problem List   Diagnosis Date Noted  . Current smoker 11/20/2014    Priority: High  . Eczema 05/22/2018    Priority: Medium  . GAD (generalized anxiety disorder) 10/01/2017    Priority: Medium  . Hyperlipidemia 03/06/2015    Priority: Medium  . Hypertension     Priority: Medium  . 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 Atypical chest pain 07/10/2015   Past Surgical History:  Procedure Laterality Date  . HERNIA REPAIR     around 25, bilateral    Family History  Problem Relation Age of Onset  . Stroke Mother        early 44s, drop foot  . Hypertension Mother   . Breast cancer Maternal Aunt     Medications- reviewed and updated Current Outpatient Medications  Medication Sig Dispense Refill  . atorvastatin (LIPITOR) 40 MG tablet TAKE 1 TABLET BY MOUTH ONCE A WEEK 12 tablet 4  . clobetasol cream (TEMOVATE) 9.24 % Apply 1 application topically 2 (two) times daily. For 7-10 days maximum for eczema (originally diagnosed by dermatology) 60 g 3  . lisinopril (ZESTRIL) 10 MG tablet TAKE 1 TABLET BY MOUTH EVERY DAY 90 tablet 3   No current facility-administered medications for this visit.    Allergies-reviewed and updated No Known Allergies  Social History   Social History Narrative   Family: Single. Girlfriend and 3 kids (older daughter plus 2 with girlfriend). 4 people in home. Son that lives in  [place]- good situation with former partner.       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       Hobbies: football, basketball with son at park, bowl at the park     Objective:  BP 132/86   Pulse 68   Temp 98.7 F (37.1 C) (Temporal)   Ht 6' (1.829 m)   Wt 194 lb 3.2 oz (88.1 kg)   SpO2 98%   BMI 26.34 kg/m  Gen: NAD, resting comfortably HEENT: Mucous membranes are moist. Oropharynx normal Neck: no thyromegaly CV: RRR no murmurs rubs or gallops Lungs: CTAB no crackles, wheeze, rhonchi Abdomen: soft/nontender/nondistended/normal bowel sounds. No rebound or guarding.  Ext: no edema Skin: warm, dry Neuro: grossly normal, moves all extremities, PERRLA    Assessment and Plan:  45 y.o. male presenting for annual physical.  Health Maintenance counseling: 1. Anticipatory guidance: Patient counseled regarding regular dental exams -q6 months, eye exams - not recently and no vision issues,  avoiding smoking and second hand smoke- see below , limiting alcohol to 2 beverages per day-1-2 beers occasionally on the weekend.   2. Risk factor reduction:  Advised patient of need for regular exercise and diet rich and fruits and vegetables to reduce risk of heart  attack and stroke. Exercise- trying to be active but no regular exercise. Diet-down 6 lbs- still trying to eat a healthy diet- lots of vegetables- trying to eat less processed foods . Goal weight 190 and then wants to maintain Wt Readings from Last 3 Encounters:  05/30/20 194 lb 3.2 oz (88.1 kg)  11/29/19 196 lb (88.9 kg)  05/23/19 200 lb 3.2 oz (90.8 kg)  3. Immunizations/screenings/ancillary studies-discussed one-time lifetime screening for hepatitis C-patient opts in  Immunization History  Administered Date(s) Administered  . Moderna Sars-Covid-2 Vaccination 11/21/2019, 12/19/2019  . Tdap 11/20/2014  4.  Prostate cancer screening-  no family history, start at age 14   5. Colon cancer screening -  no family history, start at age 80.  Can call me when he turns 45 if you would like 6. Skin cancer screening/prevention- lower risk due to melanin content. advised regular sunscreen use. Denies worrisome, changing, or new skin lesions. Discussed could follow up with dermatology for sensitive skin 7. Testicular cancer screening- advised monthly self exams  8. STD screening- patient opts out as monogamous with girlfriend 50.  Current smoker-  quarter pack per day last visit- same today. Smoking for 16 years  Status of chronic or acute concerns   #hyperlipidemia S: Medication: Atorvastatin 40Mg once a week Lab Results  Component Value Date   CHOL 213 (H) 05/23/2019   HDL 34.70 (L) 05/23/2019   LDLCALC 147 (H) 05/23/2019   LDLDIRECT 144.0 11/21/2018   TRIG 158.0 (H) 05/23/2019   CHOLHDL 6 05/23/2019   A/P: hopefully improved- has made some lifestyle changes- we will try to get at least 30% reduction from peak LDL 184- at least 128  #hypertension S: medication: Lisinopril 10Mg BP Readings from Last 3 Encounters:  05/30/20 132/86  11/29/19 128/74  05/23/19 128/78  A/P: Stable. Continue current medications.    #GAD/panic attacks S: Patient symptoms:better with deep breathing  -In the past was getting a butterfly sensation in his chest in the past with anxiety- no recent issues  Medication: None-has preferred to remain off medicine-was on Lexapro in 2019 and was helpful at that time  A/P: improved- continue current meds   #Dermatitis/eczema S:Patient seen by dermatology in the past.  Was diagnosed as either eczema or dermatitis-had scraping done which showed not fungal.  Got better with changing cream and detergent.  We refilled clobetasol cream last visit-Free and clear products have also been helpful. A/P: encouraged dermatology follow up- hed liek to get repeat allergy testing    Recommended follow up: Return in about 6 months (around 11/30/2020) for follow up- or sooner if needed.  Lab/Order associations: fasting   ICD-10-CM   1. Preventative health care  Z00.00 Comprehensive metabolic panel    CBC with Differential/Platelet    Lipid panel    Hepatitis C antibody    POCT Urinalysis Dipstick (Automated)  2. Primary hypertension  I10 Comprehensive metabolic panel    CBC with Differential/Platelet    Lipid panel  3. Hyperlipidemia, unspecified hyperlipidemia type  E78.5 Comprehensive metabolic panel    CBC with Differential/Platelet    Lipid panel  4. GAD (generalized anxiety disorder)  F41.1   5. Encounter for hepatitis C screening test for low risk patient  Z11.59 Hepatitis C antibody  6. Current smoker  F17.200 POCT Urinalysis Dipstick (Automated)    No orders of the defined types were placed in this encounter.   Return precautions advised.   [name], MD

## 2020-05-29 NOTE — Patient Instructions (Addendum)
Please stop by lab before you go If you have mychart- we will send your results within 3 business days of [place] receiving them.  If you do not have mychart- we will call you about results within 5 business days of [place] receiving them.  *please also note that you will see labs on mychart as soon as they post. I will later go in and write notes on them- will say "notes from Dr. Yong Channel"  One of the best things you can do for your health is to quit smoking-I know you can do this!  Do it for yourself and do it for your kids  Health Maintenance Due  Topic Date Due  . Hepatitis C Screening Done today in office.  Never done   Recommended follow up: Return in about 6 months (around 11/30/2020) for follow up- or sooner if needed.

## 2020-05-30 ENCOUNTER — Other Ambulatory Visit: Payer: Self-pay

## 2020-05-30 ENCOUNTER — Ambulatory Visit (INDEPENDENT_AMBULATORY_CARE_PROVIDER_SITE_OTHER): Payer: 59 | Admitting: Family Medicine

## 2020-05-30 ENCOUNTER — Encounter: Payer: Self-pay | Admitting: Family Medicine

## 2020-05-30 VITALS — BP 132/86 | HR 68 | Temp 98.7°F | Ht 72.0 in | Wt 194.2 lb

## 2020-05-30 DIAGNOSIS — F411 Generalized anxiety disorder: Secondary | ICD-10-CM

## 2020-05-30 DIAGNOSIS — Z1159 Encounter for screening for other viral diseases: Secondary | ICD-10-CM

## 2020-05-30 DIAGNOSIS — Z0000 Encounter for general adult medical examination without abnormal findings: Secondary | ICD-10-CM

## 2020-05-30 DIAGNOSIS — I10 Essential (primary) hypertension: Secondary | ICD-10-CM

## 2020-05-30 DIAGNOSIS — E785 Hyperlipidemia, unspecified: Secondary | ICD-10-CM

## 2020-05-30 DIAGNOSIS — F17200 Nicotine dependence, unspecified, uncomplicated: Secondary | ICD-10-CM

## 2020-05-30 LAB — POC URINALSYSI DIPSTICK (AUTOMATED)
Bilirubin, UA: NEGATIVE
Blood, UA: NEGATIVE
Glucose, UA: NEGATIVE
Ketones, UA: NEGATIVE
Leukocytes, UA: NEGATIVE
Nitrite, UA: NEGATIVE
Protein, UA: POSITIVE — AB
Spec Grav, UA: 1.025 (ref 1.010–1.025)
Urobilinogen, UA: 0.2 E.U./dL
pH, UA: 6 (ref 5.0–8.0)

## 2020-05-30 LAB — LIPID PANEL
Cholesterol: 220 mg/dL — ABNORMAL HIGH (ref 0–200)
HDL: 44 mg/dL (ref >39.00)
LDL Cholesterol: 136 mg/dL — ABNORMAL HIGH (ref 0–99)
NonHDL: 176.25
Total CHOL/HDL Ratio: 5
Triglycerides: 200 mg/dL — ABNORMAL HIGH (ref 0.0–149.0)
VLDL: 40 mg/dL (ref 0.0–40.0)

## 2020-05-30 LAB — COMPREHENSIVE METABOLIC PANEL
ALT: 53 U/L (ref 0–53)
AST: 22 U/L (ref 0–37)
Albumin: 4.8 g/dL (ref 3.5–5.2)
Alkaline Phosphatase: 59 U/L (ref 39–117)
BUN: 15 mg/dL (ref 6–23)
CO2: 30 mEq/L (ref 19–32)
Calcium: 10 mg/dL (ref 8.4–10.5)
Chloride: 101 mEq/L (ref 96–112)
Creatinine, Ser: 1.08 mg/dL (ref 0.40–1.50)
GFR: 83.27 mL/min (ref >60.00)
Glucose, Bld: 89 mg/dL (ref 70–99)
Potassium: 4.8 mEq/L (ref 3.5–5.1)
Sodium: 140 mEq/L (ref 135–145)
Total Bilirubin: 0.6 mg/dL (ref 0.2–1.2)
Total Protein: 7.1 g/dL (ref 6.0–8.3)

## 2020-05-30 LAB — CBC WITH DIFFERENTIAL/PLATELET
Basophils Absolute: 0 10*3/uL (ref 0.0–0.1)
Basophils Relative: 0.8 % (ref 0.0–3.0)
Eosinophils Absolute: 0.1 10*3/uL (ref 0.0–0.7)
Eosinophils Relative: 2 % (ref 0.0–5.0)
HCT: 47.3 % (ref 39.0–52.0)
Hemoglobin: 16 g/dL (ref 13.0–17.0)
Lymphocytes Relative: 25.3 % (ref 12.0–46.0)
Lymphs Abs: 1.5 10*3/uL (ref 0.7–4.0)
MCHC: 33.8 g/dL (ref 30.0–36.0)
MCV: 85.9 fl (ref 78.0–100.0)
Monocytes Absolute: 0.7 10*3/uL (ref 0.1–1.0)
Monocytes Relative: 11.1 % (ref 3.0–12.0)
Neutro Abs: 3.7 10*3/uL (ref 1.4–7.7)
Neutrophils Relative %: 60.8 % (ref 43.0–77.0)
Platelets: 305 10*3/uL (ref 150.0–400.0)
RBC: 5.5 Mil/uL (ref 4.22–5.81)
RDW: 14.3 % (ref 11.5–15.5)
WBC: 6.1 10*3/uL (ref 4.0–10.5)

## 2020-05-30 LAB — HEPATITIS C ANTIBODY
Hepatitis C Ab: NONREACTIVE
SIGNAL TO CUT-OFF: 0.01 (ref <1.00)

## 2020-05-30 NOTE — Addendum Note (Signed)
Addended by: Brandy Hale on: 05/30/2020 10:08 AM   Modules accepted: Orders

## 2020-06-02 ENCOUNTER — Other Ambulatory Visit: Payer: Self-pay

## 2020-06-02 MED ORDER — ATORVASTATIN CALCIUM 40 MG PO TABS: 40.0000 mg | ORAL_TABLET | ORAL | 3 refills | Status: DC

## 2020-06-30 ENCOUNTER — Telehealth: Payer: Self-pay

## 2020-06-30 NOTE — Telephone Encounter (Signed)
Patient confirmed he was taking medication 3x a week, instructed him to take a week off and go back down to once weekly. Told him to let us know if he has any other issues. Gave verbal understanding.

## 2020-06-30 NOTE — Telephone Encounter (Signed)
Please advise 

## 2020-06-30 NOTE — Telephone Encounter (Signed)
Please clarify-he is to be on this once a week and is now on this 3 times a week-is that correct?  If so would take a complete week off and then restart once a week.  If he does well on the month of this-may try twice a week again.  Lets hold off on increasing further until September visit.  Still have him call us if has issues again

## 2020-06-30 NOTE — Telephone Encounter (Signed)
Pt says that Dr. Yong Channel increased his cholesterol medication. Pt states " its not going well " for him. He wants to know if he should go back to the original dosage of 1x a day instead of 3x a day

## 2020-11-03 ENCOUNTER — Encounter: Payer: Self-pay | Admitting: Family Medicine

## 2020-11-03 ENCOUNTER — Telehealth (INDEPENDENT_AMBULATORY_CARE_PROVIDER_SITE_OTHER): Payer: 59 | Admitting: Family Medicine

## 2020-11-03 ENCOUNTER — Other Ambulatory Visit: Payer: Self-pay

## 2020-11-03 DIAGNOSIS — U071 COVID-19: Secondary | ICD-10-CM | POA: Diagnosis not present

## 2020-11-03 MED ORDER — ONDANSETRON 4 MG PO TBDP: 4.0000 mg | ORAL_TABLET | Freq: Three times a day (TID) | ORAL | 0 refills | Status: DC | PRN

## 2020-11-03 NOTE — Progress Notes (Signed)
Chief Complaint  Patient presents with   Covid Positive    Tested positive on Friday 10/31/20    Fever    Patient is feeling better today    Sore Throat    Nausea     Daryl Pace here for URI complaints. Due to COVID-19 pandemic, we are interacting via web portal for an electronic face-to-face visit. I verified patient's ID using 2 identifiers. Patient agreed to proceed with visit via this method. Patient is at home, I am at office. Patient and I are present for visit.   Duration: 7 days  Associated symptoms:  intermittent fevers , nausea, myalgias, mild ST, drainage from corner of L eye and itching Denies: sinus congestion, sinus pain, rhinorrhea, ear pain, wheezing, shortness of breath, and coughing, loss of taste/smell, D/V Treatment to date: Tylenol Sick contacts: No Tested + on 8/11.   Past Medical History:  Diagnosis Date   Chicken pox    Hypertension    diagnosed at work screenings   Kidney stone    around age 62, peed them out    Objective No conversational dyspnea Age appropriate judgment and insight Nml affect and mood  COVID-19 - Plan: ondansetron (ZOFRAN-ODT) 4 MG disintegrating tablet  Continue to push fluids, practice good hand hygiene, cover mouth when coughing. Artificial tears for eyes. He will send me a message if anything changes.  F/u prn. If starting to experience irreplaceable fluid loss, shaking, or shortness of breath, seek immediate care. Pt voiced understanding and agreement to the plan.  [place], DO 11/03/20 2:32 PM

## 2020-12-01 NOTE — Progress Notes (Addendum)
Phone [phone] In person visit   Subjective:   Daryl Pace is a 45 y.o. year old very pleasant male patient who presents for/with See problem oriented charting Chief Complaint  Patient presents with   Hyperlipidemia   Hypertension   This visit occurred during the SARS-CoV-2 public health emergency.  Safety protocols were in place, including screening questions prior to the visit, additional usage of staff PPE, and extensive cleaning of exam room while observing appropriate contact time as indicated for disinfecting solutions.   Past Medical History-  Patient Active Problem List   Diagnosis Date Noted   Current smoker 11/20/2014    Priority: High   Eczema 05/22/2018    Priority: Medium   GAD (generalized anxiety disorder) 10/01/2017    Priority: Medium   Hyperlipidemia 03/06/2015    Priority: Medium   Hypertension     Priority: Medium   Plantar fibromatosis 11/22/2018    Priority: Low   Atypical chest pain 07/10/2015   Medications- reviewed and updated Current Outpatient Medications  Medication Sig Dispense Refill   atorvastatin (LIPITOR) 40 MG tablet Take 1 tablet (40 mg total) by mouth 3 (three) times a week. (Patient taking differently: Take 40 mg by mouth once a week.) 39 tablet 3   clobetasol cream (TEMOVATE) 9.38 % Apply 1 application topically 2 (two) times daily. For 7-10 days maximum for eczema (originally diagnosed by dermatology) 60 g 3   lisinopril (ZESTRIL) 10 MG tablet TAKE 1 TABLET BY MOUTH EVERY DAY 90 tablet 3   No current facility-administered medications for this visit.     Objective:  BP 133/84   Pulse 76   Temp 97.9 F (36.6 C) (Temporal)   Ht 6' (1.829 m)   Wt 177 lb 6.4 oz (80.5 kg)   SpO2 99%   BMI 24.06 kg/m  Gen: NAD, resting comfortably CV: RRR no murmurs rubs or gallops Lungs: CTAB no crackles, wheeze, rhonchi Abdomen: soft/nontender/nondistended/normal bowel sounds. No rebound or guarding.  Ext: no edema Skin: warm,  dry    Assessment and Plan   # follow up for Video Visit for COVID-19 S:Patient was seen by Riki Sheer, DO on 11/03/20 for positive COVID test.  He was experiencing fever and sore throat and nausea. Also reported myalgias, drainage from corner of left eye and itching and mild ST. Denied any sinus congestion, sinus pain, rhinorrhea, ear pain, wheezing, shortness of breath and coughing, or loss of taste/smell, D/V. Used Tylenol and tested positive on 08/11. Zofran-odt 4 mg as needed.  Not treated with antivirals -Today patient reports did ok overall- no lingering symptoms- hip, butt, thighs with serious myalgias at this time and feels like lost some muscle mass  # right thoracic back pain S:started 1.5 weeks ago. Feels somewhat stiff in this area. Cannot push and reproduce pain. Deep breaths did bother it. No issues with breathing/deep breaths at this point but still has a mild pain in the area like someones thumb is constantly pressing  A/P: Patient is concerned about underlying lung cancer risk and his lung health-I strongly suspect this is musculoskeletal but did provide reassurance we offered chest x-ray today-he would like to get this completed. Also check CBC and CMP  #hyperlipidemia S: Medication:Atorvastatin 40 mg x3  a week prescribed-he is only taking once a week- increased chest fluttering sensation with prior anxiety isuses on higher dose- improved with lower dose (back to baseline)  -tried cutting out red meat Lab Results  Component Value Date  CHOL 220 (H) 05/30/2020   HDL 44.00 05/30/2020   LDLCALC 136 (H) 05/30/2020   LDLDIRECT 144.0 11/21/2018   TRIG 200.0 (H) 05/30/2020   CHOLHDL 5 05/30/2020   A/P: suspect mild poor control but tolerating this dose- continue current medicine for now. Check direct LDL as well - patient is concerned with ongoing chest fluttering sensation likely related to anxiety/panic attacks.  We discussed getting more information with coronary  artery calcium scoring-he would like to obtain for this.  We ordered this today.  #hypertension S: medication: Lisinopril 10 mg daily Home readings #s: 120s/80s at home- as low as 119/78 at home BP Readings from Last 3 Encounters:  12/05/20 133/84  05/30/20 132/86  11/29/19 128/74  A/P: Blood pressure reasonably well-controlled-continue current medications  #GAD/panic attacks S: Patient symptoms:better with deep breathing - helps with butterfly sensation in chest and abdomen-EKG, bloodwork previously with this including TSH   Medication: None- preferred to remain off medicine-was on Lexapro in 2019 and was helpful at that time A/P: stable at baseline but worrisome-see hyperlipidemia section for further work-up with patient's concerns about underlying cardiac disease  # Tobacco abuse S:smoking 5 cigarettes per day.   A/P: strongly encouraged smoking cessation- he agrees to work on this   Recommended follow up: Return in about 6 months (around 06/04/2021) for physical or sooner if needed.  Lab/Order associations:   ICD-10-CM   1. Primary hypertension  I10 CBC with Differential/Platelet    Comprehensive metabolic panel    2. Hyperlipidemia, unspecified hyperlipidemia type  E78.5 CT CARDIAC SCORING (SELF PAY ONLY)    CBC with Differential/Platelet    Comprehensive metabolic panel    LDL cholesterol, direct    3. Dermatitis  L30.9     4. GAD (generalized anxiety disorder)  F41.1     5. Acute right-sided thoracic back pain  M54.6 DG Chest 2 View    6. Tobacco abuse  Z72.0 DG Chest 2 View    7. Screen for colon cancer  Z12.11 Cologuard     No orders of the defined types were placed in this encounter.  I,Jada Bradford,acting as a scribe for [name], MD.,have documented all relevant documentation on the behalf of [name], MD,as directed by  [name], MD while in the presence of [name], MD.   I, [name], MD, have reviewed all documentation for  this visit. The documentation on 12/05/20 for the exam, diagnosis, procedures, and orders are all accurate and complete.   Return precautions advised.  [name], MD

## 2020-12-05 ENCOUNTER — Encounter: Payer: Self-pay | Admitting: Family Medicine

## 2020-12-05 ENCOUNTER — Other Ambulatory Visit: Payer: Self-pay | Admitting: Family Medicine

## 2020-12-05 ENCOUNTER — Ambulatory Visit: Payer: 59 | Admitting: Family Medicine

## 2020-12-05 ENCOUNTER — Other Ambulatory Visit: Payer: Self-pay

## 2020-12-05 VITALS — BP 133/84 | HR 76 | Temp 97.9°F | Ht 72.0 in | Wt 177.4 lb

## 2020-12-05 DIAGNOSIS — Z720 Tobacco use: Secondary | ICD-10-CM

## 2020-12-05 DIAGNOSIS — L309 Dermatitis, unspecified: Secondary | ICD-10-CM | POA: Diagnosis not present

## 2020-12-05 DIAGNOSIS — I10 Essential (primary) hypertension: Secondary | ICD-10-CM | POA: Diagnosis not present

## 2020-12-05 DIAGNOSIS — E785 Hyperlipidemia, unspecified: Secondary | ICD-10-CM | POA: Diagnosis not present

## 2020-12-05 DIAGNOSIS — Z1211 Encounter for screening for malignant neoplasm of colon: Secondary | ICD-10-CM

## 2020-12-05 DIAGNOSIS — M546 Pain in thoracic spine: Secondary | ICD-10-CM

## 2020-12-05 DIAGNOSIS — F411 Generalized anxiety disorder: Secondary | ICD-10-CM | POA: Diagnosis not present

## 2020-12-05 LAB — CBC WITH DIFFERENTIAL/PLATELET
Basophils Absolute: 0 10*3/uL (ref 0.0–0.1)
Basophils Relative: 0.5 % (ref 0.0–3.0)
Eosinophils Absolute: 0.1 10*3/uL (ref 0.0–0.7)
Eosinophils Relative: 2.5 % (ref 0.0–5.0)
HCT: 44 % (ref 39.0–52.0)
Hemoglobin: 14.5 g/dL (ref 13.0–17.0)
Lymphocytes Relative: 28.6 % (ref 12.0–46.0)
Lymphs Abs: 1.4 10*3/uL (ref 0.7–4.0)
MCHC: 33 g/dL (ref 30.0–36.0)
MCV: 85.4 fl (ref 78.0–100.0)
Monocytes Absolute: 0.6 10*3/uL (ref 0.1–1.0)
Monocytes Relative: 12.5 % — ABNORMAL HIGH (ref 3.0–12.0)
Neutro Abs: 2.8 10*3/uL (ref 1.4–7.7)
Neutrophils Relative %: 55.9 % (ref 43.0–77.0)
Platelets: 295 10*3/uL (ref 150.0–400.0)
RBC: 5.15 Mil/uL (ref 4.22–5.81)
RDW: 14.8 % (ref 11.5–15.5)
WBC: 5 10*3/uL (ref 4.0–10.5)

## 2020-12-05 LAB — COMPREHENSIVE METABOLIC PANEL
ALT: 18 U/L (ref 0–53)
AST: 17 U/L (ref 0–37)
Albumin: 4.8 g/dL (ref 3.5–5.2)
Alkaline Phosphatase: 49 U/L (ref 39–117)
BUN: 22 mg/dL (ref 6–23)
CO2: 28 mEq/L (ref 19–32)
Calcium: 9.9 mg/dL (ref 8.4–10.5)
Chloride: 99 mEq/L (ref 96–112)
Creatinine, Ser: 1.13 mg/dL (ref 0.40–1.50)
GFR: 78.58 mL/min (ref >60.00)
Glucose, Bld: 87 mg/dL (ref 70–99)
Potassium: 4.2 mEq/L (ref 3.5–5.1)
Sodium: 136 mEq/L (ref 135–145)
Total Bilirubin: 0.7 mg/dL (ref 0.2–1.2)
Total Protein: 7.2 g/dL (ref 6.0–8.3)

## 2020-12-05 LAB — LDL CHOLESTEROL, DIRECT: Direct LDL: 157 mg/dL

## 2020-12-05 NOTE — Patient Instructions (Addendum)
Health Maintenance Due  Topic Date Due   COLONOSCOPY   -We are going to do Cologuard instead-please let us know if you do not receive this within 3 weeks.  Double check with insurance to make sure this is covered. Never done    We will call you within two weeks about your referral to CT Cardiac Scoring. If you do not hear within 2 weeks, give [place] a call.    Please quit smoking! Contact 1-800-QUITNOW  or [phone]) to get enrolled.    Recommended follow up: Return in about 6 months (around 06/04/2021) for physical or sooner if needed.

## 2020-12-25 ENCOUNTER — Other Ambulatory Visit: Payer: Self-pay

## 2020-12-25 ENCOUNTER — Ambulatory Visit (INDEPENDENT_AMBULATORY_CARE_PROVIDER_SITE_OTHER)
Admission: RE | Admit: 2020-12-25 | Discharge: 2020-12-25 | Disposition: A | Discharge status: Home / Self Care | Payer: Self-pay | Source: Ambulatory Visit | Attending: Family Medicine | Admitting: Family Medicine

## 2020-12-25 DIAGNOSIS — E785 Hyperlipidemia, unspecified: Secondary | ICD-10-CM

## 2021-04-13 ENCOUNTER — Encounter: Payer: Self-pay | Admitting: Family

## 2021-04-13 ENCOUNTER — Other Ambulatory Visit: Payer: Self-pay

## 2021-04-13 ENCOUNTER — Ambulatory Visit: Payer: 59 | Admitting: Family

## 2021-04-13 VITALS — BP 138/89 | HR 80 | Temp 97.4°F | Ht 72.0 in | Wt 178.6 lb

## 2021-04-13 DIAGNOSIS — F411 Generalized anxiety disorder: Secondary | ICD-10-CM | POA: Diagnosis not present

## 2021-04-13 NOTE — Assessment & Plan Note (Addendum)
pt thinks he is having panic attacks related to his lower jaw, tooth pain, having off & on since wisdom tooth taken out about 4 years. does report stress at work, did not like Lexapro in past, advised there are meds he can try prn. pt has appt tomorrow with dentist. Advised after seeing them and receiving treatment, but still having panic attacks, to then f/u with PCP.

## 2021-04-13 NOTE — Patient Instructions (Addendum)
It was very nice to see you today!  As discussed, follow up with your dentist tomorrow. If you feel after being assessed by them and treated, and you are still having panic attacks, follow up with Dr. Yong Channel.     PLEASE NOTE:  If you had any lab tests please let us know if you have not heard back within a few days. You may see your results on MyChart before we have a chance to review them but we will give you a call once they are reviewed by [place]. If we ordered any referrals today, please let us know if you have not heard from their office within the next week.   Please try these tips to maintain a healthy lifestyle:  Eat most of your calories during the day when you are active. Eliminate processed foods including packaged sweets (pies, cakes, cookies), reduce intake of potatoes, white bread, white pasta, and white rice. Look for whole grain options, oat flour or almond flour.  Each meal should contain half fruits/vegetables, one quarter protein, and one quarter carbs (no bigger than a computer mouse).  Cut down on sweet beverages. This includes juice, soda, and sweet tea. Also watch fruit intake, though this is a healthier sweet option, it still contains natural sugar! Limit to 3 servings daily.  Drink at least 1 glass of water with each meal and aim for at least 8 glasses per day  Exercise at least 150 minutes every week.

## 2021-04-13 NOTE — Progress Notes (Signed)
Subjective:     Patient ID: Daryl Pace, male    DOB: Oct 05, 1975, 46 y.o.   MRN: [id number]  Chief Complaint  Patient presents with   Dental Pain    Pt says that he had a wisdom tooth extraction 4 years ago, and notices that he starts to have a panic attack when his tooth hurts. He left work early because this happened and his place of employment is asking for a clearance note back to work. He has a Pharmacist, community appointment set for tomorrow.     HPI: Anxiety: Patient complains of anxiety disorder and panic attacks .   He has the following symptoms: difficulty concentrating, irritable, palpitations, racing thoughts, sweating, fast heart beat .  Onset of symptoms was approximately  months ago, He denies current suicidal and homicidal ideation.  Possible organic causes contributing are: none.  Risk factors:  stress at work, works alternating shifts.  pt has had problems off & on for long time, he feels he it is related to nagging wisdom tooth extraction pain and he is seeing a dentist tomorrow for this. Previous treatment includes Lexapro.  He complains of the following side effects from the treatment:  brain fog, feeling nothing .   GAD 7 : Generalized Anxiety Score 04/13/2021  Nervous, Anxious, on Edge 2  Control/stop worrying 1  Worry too much - different things 1  Trouble relaxing 1  Restless 0  Easily annoyed or irritable 0  Afraid - awful might happen 1  Total GAD 7 Score 6  Anxiety Difficulty Somewhat difficult      Health Maintenance Due  Topic Date Due   COLONOSCOPY (Pts 45-49yr Insurance coverage will need to be confirmed)  Never done    Past Medical History:  Diagnosis Date   Chicken pox    Hypertension    diagnosed at work screenings   Kidney stone    around age 46 peed them out    Past Surgical History:  Procedure Laterality Date   HERNIA REPAIR     around 25, bilateral    Outpatient Medications Prior to Visit  Medication Sig Dispense Refill    atorvastatin (LIPITOR) 40 MG tablet Take 1 tablet (40 mg total) by mouth 3 (three) times a week. (Patient taking differently: Take 40 mg by mouth once a week.) 39 tablet 3   clobetasol cream (TEMOVATE) 02.84% Apply 1 application topically 2 (two) times daily. For 7-10 days maximum for eczema (originally diagnosed by dermatology) 60 g 3   lisinopril (ZESTRIL) 10 MG tablet TAKE 1 TABLET BY MOUTH EVERY DAY 90 tablet 3   triamcinolone cream (KENALOG) 0.1 % APPLY 1 APPLICATION TOPICALLY 2 (TWO) TIMES DAILY. FOR 7-10 DAYS MAXIMUM 80 g 2   No facility-administered medications prior to visit.    No Known Allergies      Objective:    Physical Exam Vitals and nursing note reviewed.  Constitutional:      General: He is not in acute distress.    Appearance: Normal appearance.  HENT:     Head: Normocephalic.  Cardiovascular:     Rate and Rhythm: Normal rate and regular rhythm.  Pulmonary:     Effort: Pulmonary effort is normal.     Breath sounds: Normal breath sounds.  Musculoskeletal:        General: Normal range of motion.     Cervical back: Normal range of motion.  Skin:    General: Skin is warm and dry.  Neurological:  Mental Status: He is alert and oriented to person, place, and time.  Psychiatric:        Mood and Affect: Mood normal.    BP 138/89    Pulse 80    Temp (!) 97.4 F (36.3 C) (Temporal)    Ht 6' (1.829 m)    Wt 178 lb 9.6 oz (81 kg)    SpO2 99%    BMI 24.22 kg/m  Wt Readings from Last 3 Encounters:  04/13/21 178 lb 9.6 oz (81 kg)  12/05/20 177 lb 6.4 oz (80.5 kg)  05/30/20 194 lb 3.2 oz (88.1 kg)       Assessment & Plan:   Problem List Items Addressed This Visit       Other   GAD (generalized anxiety disorder) - Primary    pt thinks he is having panic attacks related to his lower jaw, tooth pain, having off & on since wisdom tooth taken out about 4 years. does report stress at work, did not like Lexapro in past, advised there are meds he can try prn. pt  has appt tomorrow with dentist. Advised after seeing them and receiving treatment, but still having panic attacks, to then f/u with PCP.

## 2021-04-23 LAB — COLOGUARD: COLOGUARD: NEGATIVE

## 2021-05-22 NOTE — Progress Notes (Incomplete)
Phone: [phone]    Subjective:  Patient presents today for their annual physical. Chief complaint-noted.   See problem oriented charting- ROS- full  review of systems was completed and negative  except for: ***  The following were reviewed and entered/updated in epic: Past Medical History:  Diagnosis Date   Chicken pox    Hypertension    diagnosed at work screenings   Kidney stone    around age 46, peed them out   Patient Active Problem List   Diagnosis Date Noted   Plantar fibromatosis 11/22/2018   Eczema 05/22/2018   GAD (generalized anxiety disorder) 10/01/2017   Atypical chest pain 07/10/2015   Hyperlipidemia 03/06/2015   Current smoker 11/20/2014   Hypertension    Past Surgical History:  Procedure Laterality Date   HERNIA REPAIR     around 25, bilateral    Family History  Problem Relation Age of Onset   Stroke Mother        early 55s, drop foot   Hypertension Mother    Breast cancer Maternal Aunt     Medications- reviewed and updated Current Outpatient Medications  Medication Sig Dispense Refill   atorvastatin (LIPITOR) 40 MG tablet Take 1 tablet (40 mg total) by mouth 3 (three) times a week. (Patient taking differently: Take 40 mg by mouth once a week.) 39 tablet 3   clobetasol cream (TEMOVATE) 9.47 % Apply 1 application topically 2 (two) times daily. For 7-10 days maximum for eczema (originally diagnosed by dermatology) 60 g 3   lisinopril (ZESTRIL) 10 MG tablet TAKE 1 TABLET BY MOUTH EVERY DAY 90 tablet 3   triamcinolone cream (KENALOG) 0.1 % APPLY 1 APPLICATION TOPICALLY 2 (TWO) TIMES DAILY. FOR 7-10 DAYS MAXIMUM 80 g 2   No current facility-administered medications for this visit.    Allergies-reviewed and updated No Known Allergies  Social History   Social History Narrative   Family: Single. Girlfriend and 3 kids (older daughter plus 2 with girlfriend). 4 people in home. Son that lives in [place]- good situation with former partner.        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       Hobbies: football, basketball with son at park, bowl at the park      Objective:  There were no vitals taken for this visit. Gen: NAD, resting comfortably HEENT: Mucous membranes are moist. Oropharynx normal Neck: no thyromegaly CV: RRR no murmurs rubs or gallops Lungs: CTAB no crackles, wheeze, rhonchi Abdomen: soft/nontender/nondistended/normal bowel sounds. No rebound or guarding.  Ext: no edema Skin: warm, dry Neuro: grossly normal, moves all extremities, PERRLA ***    Assessment and Plan:  46 y.o. male presenting for annual physical.  Health Maintenance counseling: 1. Anticipatory guidance: Patient counseled regarding regular dental exams ***q6 months, eye exams -not recently and no vision issues,***,  avoiding smoking and second hand smoke*** , limiting alcohol to 2 beverages per day-1-2 beers occasionally on the weekend.  ***, no illicit drugs***.   2. Risk factor reduction:  Advised patient of need for regular exercise and diet rich and fruits and vegetables to reduce risk of heart attack and stroke.  Exercise- trying to be active but no regular exercise.***.  Diet/weight management--down 6 lbs- still trying to eat a healthy diet- lots of vegetables- trying to eat less processed foods . Goal weight 190  and then wants to maintain***.  Wt Readings from Last 3 Encounters:  04/13/21 178 lb 9.6 oz (81 kg)  12/05/20 177 lb 6.4 oz (80.5 kg)  05/30/20 194 lb 3.2 oz (88.1 kg)   3. Immunizations/screenings/ancillary studies Immunization History  Administered Date(s) Administered   Moderna Sars-Covid-2 Vaccination 11/21/2019, 12/19/2019   Tdap 11/20/2014   There are no preventive care reminders to display for this patient.  Family History  Problem Relation Age of Onset   Stroke Mother        early 49s,  drop foot   Hypertension Mother    Breast cancer Maternal Aunt    4. Prostate cancer screening- no family history, start at age 9 *** No results found for: PSA  5. Colon cancer screening - no family history, start at age 48.  Can call me when he turns 45 if you would like *** 6. Skin cancer screening/prevention-  lower risk due to melanin content. ***advised regular sunscreen use. Denies worrisome, changing, or new skin lesions.  7. Testicular cancer screening- advised monthly self exams *** 8. STD screening- patient opts out as monogamous with girlfriend *** 9. Smoking associated screening- current smoker-quarter pack per day last visit- same. Smoking for 16 years   ***  Status of chronic or acute concerns   # Anxiety S: Pt thought he was having panic attacks related to his lower jaw, tooth pain on and of since wisdom tooth was taken out about 4 years ago. He also reported stress at work and did not like Lexapro in the past. Advised there were medication he can try.  GAD 7 : Generalized Anxiety Score 04/13/2021 11/22/2018 05/22/2018 11/04/2017  Nervous, Anxious, on Edge 2 1 1 1   Control/stop worrying 1 0 1 1  Worry too much - different things 1 1 1 1   Trouble relaxing 1 0 1 0  Restless 0 0 0 0  Easily annoyed or irritable 0 0 0 0  Afraid - awful might happen 1 0 1 1  Total GAD 7 Score 6 2 5 4   Anxiety Difficulty Somewhat difficult Not difficult at all Somewhat difficult -   A/P: ***    right thoracic back pain S: Felt somewhat stiff in this area. Cannot push and reproduce pain. Deep breaths did bother it. No issues with breathing/deep breaths at this point but still had a mild pain in the area like someones thumb was constantly pressing  -offered a chest x-ray and pt opted in A/P: ***   #hyperlipidemia S: Medication:Atorvastatin 40 mg x3  a week prescribed-he is only taking once a week- increased chest fluttering sensation with prior anxiety isuses on higher dose- improved with lower  dose (back to baseline)  -tried cutting out red meat Lab Results  Component Value Date   CHOL 220 (H) 05/30/2020   HDL 44.00 05/30/2020   LDLCALC 136 (H) 05/30/2020   LDLDIRECT 157.0 12/05/2020   TRIG 200.0 (H) 05/30/2020   CHOLHDL 5 05/30/2020   A/P: ***   #hypertension S: medication: Lisinopril 10 mg daily Home readings #s: *** BP Readings from Last 3 Encounters:  04/13/21 138/89  12/05/20 133/84  05/30/20 132/86  A/P: ***   # Tobacco abuse S:smoking 5 cigarettes per day.   A/P: ***    Recommended follow up: No follow-ups on file. Future Appointments  Date Time Provider [place]  06/02/2021 11:20 AM Marin Olp, MD [facility]    No chief complaint on file.  Lab/Order associations:*** fasting No  diagnosis found.  No orders of the defined types were placed in this encounter.   I,Jada Bradford,acting as a scribe for [name], MD.,have documented all relevant documentation on the behalf of [name], MD,as directed by  [name], MD while in the presence of [name], MD.  *** Return precautions advised.   Burnett Corrente

## 2021-06-01 ENCOUNTER — Encounter: Payer: Self-pay | Admitting: Family Medicine

## 2021-06-02 ENCOUNTER — Ambulatory Visit (INDEPENDENT_AMBULATORY_CARE_PROVIDER_SITE_OTHER): Payer: 59 | Admitting: Family Medicine

## 2021-06-02 ENCOUNTER — Encounter: Payer: Self-pay | Admitting: Family Medicine

## 2021-06-02 VITALS — BP 130/70 | HR 69 | Temp 97.6°F | Ht 72.0 in | Wt 180.6 lb

## 2021-06-02 DIAGNOSIS — Z125 Encounter for screening for malignant neoplasm of prostate: Secondary | ICD-10-CM | POA: Diagnosis not present

## 2021-06-02 DIAGNOSIS — F411 Generalized anxiety disorder: Secondary | ICD-10-CM

## 2021-06-02 DIAGNOSIS — Z0000 Encounter for general adult medical examination without abnormal findings: Secondary | ICD-10-CM

## 2021-06-02 DIAGNOSIS — R739 Hyperglycemia, unspecified: Secondary | ICD-10-CM | POA: Diagnosis not present

## 2021-06-02 DIAGNOSIS — Z1283 Encounter for screening for malignant neoplasm of skin: Secondary | ICD-10-CM

## 2021-06-02 DIAGNOSIS — M546 Pain in thoracic spine: Secondary | ICD-10-CM

## 2021-06-02 DIAGNOSIS — Z720 Tobacco use: Secondary | ICD-10-CM

## 2021-06-02 DIAGNOSIS — I10 Essential (primary) hypertension: Secondary | ICD-10-CM

## 2021-06-02 DIAGNOSIS — E785 Hyperlipidemia, unspecified: Secondary | ICD-10-CM

## 2021-06-02 LAB — POC URINALSYSI DIPSTICK (AUTOMATED)
Bilirubin, UA: NEGATIVE
Blood, UA: NEGATIVE
Glucose, UA: NEGATIVE
Ketones, UA: NEGATIVE
Leukocytes, UA: NEGATIVE
Nitrite, UA: NEGATIVE
Protein, UA: NEGATIVE
Spec Grav, UA: 1.025 (ref 1.010–1.025)
Urobilinogen, UA: 0.2 E.U./dL
pH, UA: 6 (ref 5.0–8.0)

## 2021-06-02 LAB — COMPREHENSIVE METABOLIC PANEL
ALT: 22 U/L (ref 0–53)
AST: 18 U/L (ref 0–37)
Albumin: 4.9 g/dL (ref 3.5–5.2)
Alkaline Phosphatase: 53 U/L (ref 39–117)
BUN: 17 mg/dL (ref 6–23)
CO2: 29 mEq/L (ref 19–32)
Calcium: 9.7 mg/dL (ref 8.4–10.5)
Chloride: 102 mEq/L (ref 96–112)
Creatinine, Ser: 0.96 mg/dL (ref 0.40–1.50)
GFR: 95.23 mL/min (ref >60.00)
Glucose, Bld: 88 mg/dL (ref 70–99)
Potassium: 4.3 mEq/L (ref 3.5–5.1)
Sodium: 138 mEq/L (ref 135–145)
Total Bilirubin: 0.8 mg/dL (ref 0.2–1.2)
Total Protein: 6.7 g/dL (ref 6.0–8.3)

## 2021-06-02 LAB — LIPID PANEL
Cholesterol: 240 mg/dL — ABNORMAL HIGH (ref 0–200)
HDL: 52.3 mg/dL (ref >39.00)
LDL Cholesterol: 170 mg/dL — ABNORMAL HIGH (ref 0–99)
NonHDL: 187.77
Total CHOL/HDL Ratio: 5
Triglycerides: 89 mg/dL (ref 0.0–149.0)
VLDL: 17.8 mg/dL (ref 0.0–40.0)

## 2021-06-02 LAB — PSA: PSA: 2.21 ng/mL (ref 0.10–4.00)

## 2021-06-02 LAB — CBC WITH DIFFERENTIAL/PLATELET
Basophils Absolute: 0 10*3/uL (ref 0.0–0.1)
Basophils Relative: 1 % (ref 0.0–3.0)
Eosinophils Absolute: 0.1 10*3/uL (ref 0.0–0.7)
Eosinophils Relative: 2.8 % (ref 0.0–5.0)
HCT: 45.4 % (ref 39.0–52.0)
Hemoglobin: 15.2 g/dL (ref 13.0–17.0)
Lymphocytes Relative: 26.9 % (ref 12.0–46.0)
Lymphs Abs: 1.3 10*3/uL (ref 0.7–4.0)
MCHC: 33.5 g/dL (ref 30.0–36.0)
MCV: 87.2 fl (ref 78.0–100.0)
Monocytes Absolute: 0.5 10*3/uL (ref 0.1–1.0)
Monocytes Relative: 11 % (ref 3.0–12.0)
Neutro Abs: 2.8 10*3/uL (ref 1.4–7.7)
Neutrophils Relative %: 58.3 % (ref 43.0–77.0)
Platelets: 298 10*3/uL (ref 150.0–400.0)
RBC: 5.2 Mil/uL (ref 4.22–5.81)
RDW: 14.3 % (ref 11.5–15.5)
WBC: 4.8 10*3/uL (ref 4.0–10.5)

## 2021-06-02 LAB — HEMOGLOBIN A1C: Hgb A1c MFr Bld: 5.8 % (ref 4.6–6.5)

## 2021-06-02 MED ORDER — LISINOPRIL 5 MG PO TABS: 5.0000 mg | ORAL_TABLET | Freq: Every day | ORAL | 3 refills | Status: DC

## 2021-06-02 NOTE — Patient Instructions (Addendum)
blood pressure is wel controlled here and sounds like over controlled at home and gets some lightheadedness at times- we opted to reduce to 5 mg in light of his efforts for weight loss and recheck next visit unless home blood pressure gets >135/85 on average ? ?Please stop by lab before you go ?If you have mychart- we will send your results within 3 business days of [place] receiving them.  ?If you do not have mychart- we will call you about results within 5 business days of [place] receiving them.  ?*please also note that you will see labs on mychart as soon as they post. I will later go in and write notes on them- will say "notes from Dr. Yong Channel"  ? ?Reduce lisinopril to 5 mg- let me know if BP goes lower or if goes above 135/85 on average ? ?Recommended follow up: No follow-ups on file. ?

## 2021-06-05 ENCOUNTER — Other Ambulatory Visit: Payer: Self-pay | Admitting: Family Medicine

## 2021-06-06 ENCOUNTER — Other Ambulatory Visit: Payer: Self-pay | Admitting: Family Medicine

## 2021-06-15 ENCOUNTER — Encounter: Payer: 59 | Admitting: Family Medicine

## 2021-11-26 ENCOUNTER — Encounter: Payer: Self-pay | Admitting: Family Medicine

## 2021-11-26 ENCOUNTER — Ambulatory Visit: Payer: 59 | Admitting: Family Medicine

## 2021-11-26 VITALS — BP 130/68 | HR 94 | Temp 97.9°F | Ht 72.0 in | Wt 183.4 lb

## 2021-11-26 DIAGNOSIS — E785 Hyperlipidemia, unspecified: Secondary | ICD-10-CM | POA: Diagnosis not present

## 2021-11-26 DIAGNOSIS — I10 Essential (primary) hypertension: Secondary | ICD-10-CM

## 2021-11-26 MED ORDER — ROSUVASTATIN CALCIUM 10 MG PO TABS: 10.0000 mg | ORAL_TABLET | ORAL | 3 refills | Status: DC

## 2021-11-26 NOTE — Progress Notes (Signed)
Phone [phone] In person visit   Subjective:   Daryl Pace is a 46 y.o. year old very pleasant male patient who presents for/with See problem oriented charting Chief Complaint  Patient presents with   Follow-up   Hypertension   Past Medical History-  Patient Active Problem List   Diagnosis Date Noted   Current smoker 11/20/2014    Priority: High   Eczema 05/22/2018    Priority: Medium    GAD (generalized anxiety disorder) 10/01/2017    Priority: Medium    Hyperlipidemia 03/06/2015    Priority: Medium    Hypertension     Priority: Medium    Plantar fibromatosis 11/22/2018    Priority: Low   Atypical chest pain 07/10/2015    Medications- reviewed and updated Current Outpatient Medications  Medication Sig Dispense Refill   augmented betamethasone dipropionate (DIPROLENE-AF) 0.05 % cream SMARTSIG:1 sparingly Topical Twice Daily     clobetasol cream (TEMOVATE) 0.05 % Apply 1 application topically 2 (two) times daily. For 7-10 days maximum for eczema (originally diagnosed by dermatology) 60 g 3   lisinopril (ZESTRIL) 5 MG tablet Take 1 tablet (5 mg total) by mouth daily. 90 tablet 3   rosuvastatin (CRESTOR) 10 MG tablet Take 1 tablet (10 mg total) by mouth once a week. 13 tablet 3   triamcinolone cream (KENALOG) 0.1 % APPLY 1 APPLICATION TOPICALLY 2 (TWO) TIMES DAILY. FOR 7-10 DAYS MAXIMUM 80 g 2   No current facility-administered medications for this visit.     Objective:  BP 130/68   Pulse 94   Temp 97.9 F (36.6 C)   Ht 6' (1.829 m)   Wt 183 lb 6.4 oz (83.2 kg)   SpO2 98%   BMI 24.87 kg/m  Gen: NAD, resting comfortably CV: RRR no murmurs rubs or gallops Lungs: CTAB no crackles, wheeze, rhonchi Ext: no edema Skin: warm, dry     Assessment and Plan   #hypertension S: medication: lisinopril 5 mg BP Readings from Last 3 Encounters:  11/26/21 130/68  06/02/21 130/70  04/13/21 138/89  A/P: Controlled. Continue current medications.    #hyperlipidemia- thankfully ct cardiac scoring of 0  S: Medication: atorvastatin 40 mg 3x a week every other week- gets dizziness. Also had anxiety on higher doses- notes on long car rides- fluttering in chest sensation and with haircuts with getting buttoning around neck (looser helps).  Lab Results  Component Value Date   CHOL 240 (H) 06/02/2021   HDL 52.30 06/02/2021   LDLCALC 170 (H) 06/02/2021   LDLDIRECT 157.0 12/05/2020   TRIG 89.0 06/02/2021   CHOLHDL 5 06/02/2021   A/P: cholesterol suspect is elevated- high last visit but was not really taking regularly- now doing 3x every other week but having some side effects as above- we will trial rosuvastatin 10 mg once a week- he thinks this would be simpler and we could go up if needed at physical in 6 months -reassuring ct cardiac scoring gives [place] some space to be less aggressive -statin may slightly increase prediabetes risk  # Hyperglycemia/insulin resistance/prediabetes S:  Medication: none Lab Results  Component Value Date   HGBA1C 5.8 06/02/2021  A/P: weight up slightly from last visit- want him to stay stable on this -also encouraged regular intentional exercise  # anxiety S: prior butterfly  feeling seems more prominent with more consistent use of cholesterol medicine A/P: we are going to switch dose to see if that helps   # Current smoker - 5 cigarettes per day-  encouraged full cessation -prior right sided pain that we ordered CXR for resolved- thinks it was gas- there were certain triggers.   Recommended follow up: Return in about 6 months (around 05/27/2022) for physical or sooner if needed.Schedule b4 you leave.  Lab/Order associations:   ICD-10-CM   1. Primary hypertension  I10     2. Hyperlipidemia, unspecified hyperlipidemia type  E78.5       Meds ordered this encounter  Medications   rosuvastatin (CRESTOR) 10 MG tablet    Sig: Take 1 tablet (10 mg total) by mouth once a week.    Dispense:  13 tablet     Refill:  3    Return precautions advised.  Tana Conch, MD

## 2021-11-26 NOTE — Patient Instructions (Addendum)
Exercise prescription long term is 5 days a week for 30 minutes. In the short run can do 10-15 minutes 2-3 x a week and work on building up.  -we need the exercise not for weight loss but to help your body handle the current slightly elevated sugar levels  Recommended follow up: Return in about 6 months (around 05/27/2022) for physical or sooner if needed.Schedule b4 you leave.

## 2022-03-31 ENCOUNTER — Ambulatory Visit: Payer: 59 | Admitting: Internal Medicine

## 2022-03-31 ENCOUNTER — Encounter: Payer: Self-pay | Admitting: Internal Medicine

## 2022-03-31 VITALS — BP 148/90 | HR 96 | Temp 98.5°F | Ht 72.0 in | Wt 184.2 lb

## 2022-03-31 DIAGNOSIS — F17200 Nicotine dependence, unspecified, uncomplicated: Secondary | ICD-10-CM

## 2022-03-31 DIAGNOSIS — R079 Chest pain, unspecified: Secondary | ICD-10-CM

## 2022-03-31 DIAGNOSIS — R002 Palpitations: Secondary | ICD-10-CM | POA: Insufficient documentation

## 2022-03-31 DIAGNOSIS — F17210 Nicotine dependence, cigarettes, uncomplicated: Secondary | ICD-10-CM

## 2022-03-31 DIAGNOSIS — F419 Anxiety disorder, unspecified: Secondary | ICD-10-CM | POA: Diagnosis not present

## 2022-03-31 MED ORDER — ASPIRIN 81 MG PO TBEC: 81.0000 mg | DELAYED_RELEASE_TABLET | Freq: Every day | ORAL | 3 refills | Status: DC

## 2022-03-31 MED ORDER — HYDROXYZINE HCL 25 MG PO TABS: 25.0000 mg | ORAL_TABLET | Freq: Every evening | ORAL | 0 refills | Status: DC | PRN

## 2022-03-31 NOTE — Patient Instructions (Addendum)
It was a pleasure seeing you today!  Your health and satisfaction are my top priorities. If you believe your experience today was worthy of a 5-star rating, I'd be grateful for your feedback! Loralee Pacas, MD   CHECKOUT CHECKLIST  []    Schedule next appointment(s):    Return in about 2 weeks (around 04/14/2022) for f/u palpitations workup with pcp Dr. Yong Channel.  Any requested lab visits should be scheduled as appointments too  If you are not doing well:  Return to the office sooner Please bring all your medicine bottles to each appointment If your condition begins to worsen or become severe:  go to the emergency room or even call 911  []    Sign release of information authorizations: Any records we need for your care and to be your medical home  REMINDERS:  []    Go to emergency room if chest pain worsens or you get more short of breath.  []    X-rays can be obtained at the  [facility] office. You can walk in M-F between 8:30am- noon or 1pm - 5pm. Tell them you are there for xrays ordered by me. They will send me the results, then I will let you know the results with instructions. Address: [address].  The Xray department is located in the basement.     []   (Optional):  Review your clinical notes on MyChart after they are completed.     Today's draft of the physician documented plan for today's visit: (final revisions will be visible on MyChart chart later) Chest pain, unspecified type -     EKG 12-Lead -     HOLTER MONITOR - 48 HOUR; Future -     TSH -     Troponin T, High Sensitivity (hs-TnT) -     Lipid panel; Future -     CT CARDIAC SCORING (SELF PAY ONLY); Future -     ECHOCARDIOGRAM COMPLETE; Future -     DG Chest 2 View; Future  Palpitations -     HOLTER MONITOR - 48 HOUR; Future -     TSH -     CBC -     Comprehensive metabolic panel -     Troponin T, High Sensitivity (hs-TnT) -     Lipid panel; Future  Anxiety disorder, unspecified type -      hydrOXYzine HCl; Take 1 tablet (25 mg total) by mouth at bedtime as needed for anxiety (insomnia). Take one 25 mg tablet 30-60 minutes prior to bedtime for insomnia, anxiety. May increase to two tablets.  Dispense: 60 tablet; Refill: 0    QUESTIONS & CONCERNS: CLINICAL: please contact us via phone [phone] OR MyChart messaging  LAB & IMAGING:   We will call you if the results are significantly abnormal or you don't use MyChart.  Most normal results will be posted to MyChart immediately and have a clinical review message by Dr. Randol Kern posted within 2-3 business days.   If you have not heard from [place] regarding the results in 2 weeks OR if you need priority reporting, please contact this office. MYCHART:  The fastest way to get your results and easiest way to stay in touch with [place] is by activating your My Chart account. Instructions are located on the last page of this paperwork.  BILLING: xray and lab orders are billed from separate companies and questions./concerns should be directed to the [facility].  For visit charges please discuss with our  administrative services COMPLAINTS:  please let Dr. Randol Kern know or see the [facility], by asking at the front desk: we want you to be satisfied with every experience and we would be grateful for the opportunity to address any problems

## 2022-03-31 NOTE — Progress Notes (Signed)
[facility]: [phone]   Routine Medical Office Visit  Patient:  Daryl Pace      Age: 47 y.o.       Sex:  male  Date:   03/31/2022  PCP:    Marin Olp, [place] Provider: Loralee Pacas, MD   Problem Focused Charting:   Medical Decision Making per Assessment/Plan   Jacey was seen today for increased pulse.  Chest pain, unspecified type -     EKG 12-Lead -     HOLTER MONITOR - 48 HOUR; Future -     TSH -     Troponin T, High Sensitivity (hs-TnT) -     Lipid panel; Future -     ECHOCARDIOGRAM COMPLETE; Future -     DG Chest 2 View; Future -     Aspirin; Take 1 tablet (81 mg total) by mouth daily. Swallow whole.  Dispense: 90 tablet; Refill: 3 -     CT CARDIAC SCORING ([facility] LOCATIONS ONLY); Future  Palpitations -     HOLTER MONITOR - 48 HOUR; Future -     TSH -     CBC -     Comprehensive metabolic panel -     Troponin T, High Sensitivity (hs-TnT) -     Lipid panel; Future  Anxiety disorder, unspecified type -     hydrOXYzine HCl; Take 1 tablet (25 mg total) by mouth at bedtime as needed for anxiety (insomnia). Take one 25 mg tablet 30-60 minutes prior to bedtime for insomnia, anxiety. May increase to two tablets.  Dispense: 60 tablet; Refill: 0  Current smoker Assessment & Plan: Recommmend stop Offered Chantix wellbutrin, gum, patches, he declined all for now.     In my medical opinion this is most likely PSVT from palpitations- the phone reading he had that read as possible a fib seemed to be sinus rhythm when I looked at it.  He feels soreness in his chest since palps so its possible he had unstable angina a couple days ago but no chest pain now so no need for emergency room now.   Artificial intelligence analysis created from hpi generated with patient, labs/imaging ordered from this review after shared decision making:   Comprehensive Review of the Case: The patient is a 47 year old individual who presents with  a high heart rate, racing heart, and very hard pounding heart rate for the last few days. Accompanying symptoms include sensations of bubbling and sometimes burning throughout the left chest wall and upper abdomen. There is no shortness of breath, dizziness, or history of confirmed atrial fibrillation, although electrocardiographic recordings from the patient's phone suggest atrial fibrillation at a rate of 151. The patient has a known anxiety disorder, is slightly overweight, smokes, has high cholesterol, and has no family history of early coronary disease. Medications previously prescribed for cholesterol and blood pressure were not tolerated and have been discontinued, affecting the patient's ability to drive. The blood pressure is elevated at 148/90. An EKG showed J waves in most of the beats, but other intervals were normal. No laboratory data, imaging data, other study findings, or course of illness are provided in the summary.  Most Likely Dx:  Atrial Fibrillation (AF). The patient's symptoms of high heart rate, racing heart, and palpitations, along with the EKG findings suggestive of atrial fibrillation, point towards AF as the most likely diagnosis. The presence of J waves could be an incidental finding or related to other cardiac conditions,  but they do not preclude the diagnosis of AF. The patient's risk factors, including smoking, high cholesterol, and being slightly overweight, contribute to the likelihood of this diagnosis. The presence of symptoms such as syncope, chest pain, or a family history of AF could further suggest this diagnosis.  Expanded DDx:  1. Anxiety Disorder with Panic Attacks: The patient's known anxiety disorder could manifest with symptoms of palpitations, chest sensations, and a racing heart, which can mimic cardiac conditions like atrial fibrillation. The absence of shortness of breath and dizziness, common in panic attacks, does not exclude this diagnosis. The presence of  a normal EKG during symptomatic episodes could further suggest this diagnosis.  2. Paroxysmal Supraventricular Tachycardia (PSVT): PSVT could explain the patient's sudden onset of a racing heart and palpitations. The EKG findings of J waves are not typical for PSVT but do not rule it out. The presence of a narrow complex tachycardia on an EKG during symptoms could further suggest this diagnosis.  Alternative DDx:  1. Coronary Artery Disease (CAD): Despite the absence of a family history of early coronary disease, the patient's risk factors, including smoking, high cholesterol, and hypertension, could predispose them to CAD, which can present with atypical symptoms such as the sensations described. The presence of ischemic changes on an EKG or positive stress test could further suggest this diagnosis.  2. Gastroesophageal Reflux Disease (GERD): The burning sensation in the upper abdomen and left chest wall could be attributed to GERD, which can also cause atypical chest pain and mimic cardiac conditions. The presence of symptoms such as heartburn, regurgitation, or response to antacid medications could further suggest this diagnosis.  3. Costochondritis: This inflammatory condition could explain the chest wall sensations described by the patient. The absence of associated respiratory symptoms and the presence of localized tenderness on the chest wall could further suggest this diagnosis.  4. Hyperthyroidism: Although not mentioned in the patient's history, hyperthyroidism can cause symptoms of palpitations, increased heart rate, and anxiety. The presence of other symptoms such as weight loss, heat intolerance, or a goiter could further suggest this diagnosis.  5. Pheochromocytoma: This rare tumor can cause episodic hypertension and symptoms of palpitations, headache, and sweating. The presence of elevated urinary catecholamines could further suggest this diagnosis.  6. Cardiomyopathy: Various forms of  cardiomyopathy could present with palpitations and atypical chest sensations. The presence of echocardiographic abnormalities such as reduced ejection fraction or wall motion abnormalities could further suggest this diagnosis.  7. Pulmonary Embolism (PE): Although the patient denies shortness of breath, PE can sometimes present with atypical symptoms such as palpitations and chest discomfort. The presence of risk factors for venous thromboembolism and findings such as a ventilation-perfusion mismatch on imaging could further suggest this diagnosis.  8. Pericarditis: This condition could cause chest pain and EKG changes. The presence of a pericardial friction rub or diffuse ST elevations on EKG could further suggest this diagnosis.  9. Mitral Valve Prolapse (MVP): MVP can cause palpitations and atypical chest pain. The presence of a mid-systolic click or murmur on auscultation could further suggest this diagnosis.  10. Electrolyte Imbalance: Abnormalities in electrolytes such as potassium or magnesium can cause palpitations and EKG changes. The presence of abnormal serum electrolyte levels could further suggest this diagnosis.  #Palpitations  The patient is a 47 year old male presenting with palpitations characterized by high heart rates, racing, and a very hard pounding sensation for the last few days. He also reports atypical chest sensations but denies shortness of breath, dizziness, and  has no personal history of heart disease. His cholesterol levels are elevated, and he has a history of intolerance to cholesterol and blood pressure medications. The patient's EKG shows J waves but is otherwise unremarkable, and he has an electrocardiographic recording suggestive of atrial fibrillation at a rate of 151 bpm. He has a known anxiety disorder, smokes, is slightly overweight, and has high cholesterol. Given the EKG findings and the patient's symptoms, a cardiac etiology such as atrial fibrillation is  possible. However, his known anxiety disorder could also be contributing to his symptoms. The differential diagnosis includes atrial fibrillation, anxiety-related palpitations, premature ventricular contractions, and other less common causes such as thyroid dysfunction or electrolyte imbalances.  Dx - 12 Lead EKG/ECG - Chest X-Ray - Labs: CBC, BMP, Mg, Ca, TSH, FT4, Troponin - Ambulatory rhythm monitor (Holter monitor for 24-48 hours or patch monitor for 7-14 days) - Transthoracic echocardiogram - Serum or urine toxicology screening, if substance ingestion is suspected - Stress testing, if palpitations are associated with physical exertion  Tx - Treat based on etiology - If palpitation symptoms are debilitating, consider symptomatic treatment with a beta blocker, unless contraindicated  Today's key discussion points and After Visit Summary (AVS) reminders. Problem-associated medical records were reviewed with @HIM @ during the appointment He was encouraged to contact our office by phone or message via MyChart if @HE @ has any questions or concerns regarding our treatment plan (see AVS). He was given an opportunity to ask questions/clarifications about any aspect of the diagnosis and treatment plan at today's visit.  Medication list was reconciled and patient instructions and summary information was documented and made available for @HIM @ to review in the AVS (see AVS). We discussed red flag symptoms and signs in detail and when to call the office or go to ER if @HIS @ condition worsens (see AVS). He  expressed understanding and AVS is used to reinforce.  This entire medical encounter document is also available on MyChart for @HIM @ to review for accuracy and understanding.  Review of this document is encouraged in the AVS. Follow up recommended:  Return in about 2 weeks (around 04/14/2022) for f/u palpitations workup with pcp Dr. Yong Channel.    Subjective - Clinical Presentation:   Keyonte Cookston is a 47 y.o. male  Patient Active Problem List   Diagnosis Date Noted   Palpitations 03/31/2022   Plantar fibromatosis 11/22/2018   Eczema 05/22/2018   GAD (generalized anxiety disorder) 10/01/2017   Atypical chest pain 07/10/2015   Hyperlipidemia 03/06/2015   Current smoker 11/20/2014   Hypertension    Past Medical History:  Diagnosis Date   Chicken pox    Hypertension    diagnosed at work screenings   Kidney stone    around age 63, peed them out    Outpatient Medications Prior to Visit  Medication Sig   augmented betamethasone dipropionate (DIPROLENE-AF) 0.05 % cream SMARTSIG:1 sparingly Topical Twice Daily   clobetasol cream (TEMOVATE) 1.02 % Apply 1 application topically 2 (two) times daily. For 7-10 days maximum for eczema (originally diagnosed by dermatology)   triamcinolone cream (KENALOG) 0.1 % APPLY 1 APPLICATION TOPICALLY 2 (TWO) TIMES DAILY. FOR 7-10 DAYS MAXIMUM   [DISCONTINUED] lisinopril (ZESTRIL) 5 MG tablet Take 1 tablet (5 mg total) by mouth daily.   [DISCONTINUED] rosuvastatin (CRESTOR) 10 MG tablet Take 1 tablet (10 mg total) by mouth once a week.   No facility-administered medications prior to visit.    Chief Complaint  Patient presents with   increased  pulse    Pt c/o increased HR that started Sunday after he got home from getting something to eat along with chest pain.    HPI  This is a 47 year old male who presents with main concern high heart rates and racing heart and very hard pounding heart rate for the last few days since about Sunday.  There is also some sensation of the he would not call it pain but maybe like a bubbling a sometimes burning type throughout the left chest wall and upper abdomen.  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  He also has no personal  history of heart disease and a cholesterol profile is not bad although he was prescribed cholesterol medicine that he was not able to tolerate and the same with the blood pressure medication.  Blood pressure is slightly up today without the medication that he has discontinued due to it was affecting his driving.  On his phone he has some electrocardiographic recordings suggestive of atrial fibrillation at a rate of 151 but does not have a history of confirmed atrial fibrillation.  He smokes he is slightly overweight has high cholesterol no family history of early coronary disease.  He denies any substance use whatsoever.  He has known anxiety disorder.  Vital signs are normal but blood pressure is 148/90 EKG was reviewed and interpreted in this with full view of the patient and it was not totally normal except for some J waves that were noted in most of the beats the intervals were fine.       Objective:  Physical Exam  BP (!) 148/90   Pulse 96   Temp 98.5 F (36.9 C)   Ht 6' (1.829 m)   Wt 184 lb 3.2 oz (83.6 kg)   SpO2 98%   BMI 24.98 kg/m  Well developed, well nourished  by BMI criteria but truncal adiposity (waist circumference or caliper) should be used instead. Wt Readings from Last 10 Encounters:  03/31/22 184 lb 3.2 oz (83.6 kg)  11/26/21 183 lb 6.4 oz (83.2 kg)  06/02/21 180 lb 9.6 oz (81.9 kg)  04/13/21 178 lb 9.6 oz (81 kg)  12/05/20 177 lb 6.4 oz (80.5 kg)  05/30/20 194 lb 3.2 oz (88.1 kg)  11/29/19 196 lb (88.9 kg)  05/23/19 200 lb 3.2 oz (90.8 kg)  11/22/18 199 lb 12.8 oz (90.6 kg)  05/22/18 202 lb 3.2 oz (91.7 kg)  Vital signs reviewed.  Nursing notes reviewed. Weight trend reviewed. General Appearance:  Well developed, well nourished male in no acute distress.   Normal work of breathing at rest Musculoskeletal: All extremities are intact.  Neurological:  Awake, alert,  No obvious focal neurological deficits or cognitive impairments Psychiatric:  Appropriate mood,  pleasant demeanor Anxious appearing Problem-specific findings:  no diaphoresis or obvious discomfort.      EKG: normal EKG, normal sinus rhythm, normal sinus rhythm, j waves noted.    Signed: Lula Olszewski, MD 03/31/2022 5:11 PM

## 2022-03-31 NOTE — Assessment & Plan Note (Signed)
Recommmend stop Offered Chantix wellbutrin, gum, patches, he declined all for now.

## 2022-04-01 ENCOUNTER — Ambulatory Visit: Payer: 59 | Attending: Internal Medicine

## 2022-04-01 DIAGNOSIS — R002 Palpitations: Secondary | ICD-10-CM

## 2022-04-01 DIAGNOSIS — R079 Chest pain, unspecified: Secondary | ICD-10-CM

## 2022-04-01 LAB — CBC
HCT: 46 % (ref 39.0–52.0)
Hemoglobin: 15.3 g/dL (ref 13.0–17.0)
MCHC: 33.3 g/dL (ref 30.0–36.0)
MCV: 87 fl (ref 78.0–100.0)
Platelets: 285 10*3/uL (ref 150.0–400.0)
RBC: 5.29 Mil/uL (ref 4.22–5.81)
RDW: 14.7 % (ref 11.5–15.5)
WBC: 5.7 10*3/uL (ref 4.0–10.5)

## 2022-04-01 LAB — COMPREHENSIVE METABOLIC PANEL
ALT: 35 U/L (ref 0–53)
AST: 23 U/L (ref 0–37)
Albumin: 5.1 g/dL (ref 3.5–5.2)
Alkaline Phosphatase: 63 U/L (ref 39–117)
BUN: 22 mg/dL (ref 6–23)
CO2: 28 mEq/L (ref 19–32)
Calcium: 10.3 mg/dL (ref 8.4–10.5)
Chloride: 104 mEq/L (ref 96–112)
Creatinine, Ser: 1.19 mg/dL (ref 0.40–1.50)
GFR: 73.17 mL/min (ref >60.00)
Glucose, Bld: 100 mg/dL — ABNORMAL HIGH (ref 70–99)
Potassium: 5.2 mEq/L — ABNORMAL HIGH (ref 3.5–5.1)
Sodium: 145 mEq/L (ref 135–145)
Total Bilirubin: 0.5 mg/dL (ref 0.2–1.2)
Total Protein: 7.4 g/dL (ref 6.0–8.3)

## 2022-04-01 LAB — LIPID PANEL
Cholesterol: 271 mg/dL — ABNORMAL HIGH (ref 0–200)
HDL: 46.5 mg/dL (ref >39.00)
NonHDL: 224.58
Total CHOL/HDL Ratio: 6
Triglycerides: 222 mg/dL — ABNORMAL HIGH (ref 0.0–149.0)
VLDL: 44.4 mg/dL — ABNORMAL HIGH (ref 0.0–40.0)

## 2022-04-01 LAB — LDL CHOLESTEROL, DIRECT: Direct LDL: 191 mg/dL

## 2022-04-01 LAB — TSH: TSH: 1.76 u[IU]/mL (ref 0.35–5.50)

## 2022-04-01 NOTE — Progress Notes (Unsigned)
Enrolled for [employer] to mail a ZIO XT long term holter monitor to the patients address on file.   DOD to read. 

## 2022-04-02 LAB — TROPONIN T, HIGH SENSITIVITY (HS-TNT): Troponin T (Highly Sensitive): <6 ng/L (ref <23)

## 2022-04-04 NOTE — Progress Notes (Signed)
I have reviewed your recent results and, in my medical opinion:  There is nothing concerning.  Borderline normal potassium and glucose can be ignored.  High cholesterol is concerning but its been bad for a while.  Diet change can help.  Definitely no heart injury prior to the labwork being done. Loralee Pacas, MD  04/04/2022 5:15 PM

## 2022-04-16 ENCOUNTER — Encounter: Payer: Self-pay | Admitting: Family Medicine

## 2022-04-16 ENCOUNTER — Ambulatory Visit: Payer: 59 | Admitting: Family Medicine

## 2022-04-16 VITALS — BP 120/72 | HR 86 | Temp 98.3°F | Ht 72.0 in | Wt 181.6 lb

## 2022-04-16 DIAGNOSIS — F17200 Nicotine dependence, unspecified, uncomplicated: Secondary | ICD-10-CM

## 2022-04-16 DIAGNOSIS — E785 Hyperlipidemia, unspecified: Secondary | ICD-10-CM

## 2022-04-16 DIAGNOSIS — I10 Essential (primary) hypertension: Secondary | ICD-10-CM

## 2022-04-16 DIAGNOSIS — L309 Dermatitis, unspecified: Secondary | ICD-10-CM

## 2022-04-16 DIAGNOSIS — R002 Palpitations: Secondary | ICD-10-CM

## 2022-04-16 MED ORDER — TRIAMCINOLONE ACETONIDE 0.1 % EX CREA: 1.0000 | TOPICAL_CREAM | Freq: Two times a day (BID) | CUTANEOUS | 4 refills | Status: DC

## 2022-04-16 NOTE — Patient Instructions (Addendum)
Trial Pepcid before breakfast and dinner 10 or 20mg  - if this prevents the issue then could be reflux related. If it does not may be more isolated gas and can go back to gas x or beano  I think its ok to hold off on x-ray for now UNLESS symptoms fail to improve- get x-ray if they do not improve Please go to [facility] (updated 05/17/2019) - located [address] across the street from [place] - in the basement - Hours: 8:30-5:00 PM M-F (with lunch from 12:30- 1 PM). You do NOT need an appointment.    - holding on x-ray for now -cardiac monitoring pending -ct calcium scoring 18 months ago- he is still consideirng doing this repeat -still needs echocardiogram  -call cardiology if you do not hear by next week about echo  Recommended follow up: Return for next already scheduled visit or sooner if needed.

## 2022-04-16 NOTE — Progress Notes (Signed)
Phone [phone] In person visit   Subjective:   Daryl Pace is a 47 y.o. year old very pleasant male patient who presents for/with See problem oriented charting Chief Complaint  Patient presents with   Follow-up    (No Mask)   Hypertension    Past Medical History-  Patient Active Problem List   Diagnosis Date Noted   Current smoker 11/20/2014    Priority: High   Palpitations 03/31/2022    Priority: Medium    Eczema 05/22/2018    Priority: Medium    GAD (generalized anxiety disorder) 10/01/2017    Priority: Medium    Atypical chest pain 07/10/2015    Priority: Medium    Hyperlipidemia 03/06/2015    Priority: Medium    Hypertension     Priority: Medium    Plantar fibromatosis 11/22/2018    Priority: Low    Medications- reviewed and updated Current Outpatient Medications  Medication Sig Dispense Refill   aspirin EC 81 MG tablet Take 1 tablet (81 mg total) by mouth daily. Swallow whole. 90 tablet 3   hydrOXYzine (ATARAX) 25 MG tablet Take 1 tablet (25 mg total) by mouth at bedtime as needed for anxiety (insomnia). Take one 25 mg tablet 30-60 minutes prior to bedtime for insomnia, anxiety. May increase to two tablets. 60 tablet 0   triamcinolone cream (KENALOG) 0.1 % Apply 1 Application topically 2 (two) times daily. For 7-10 days maximum 80 g 4   No current facility-administered medications for this visit.     Objective:  BP 120/72   Pulse 86   Temp 98.3 F (36.8 C)   Ht 6' (1.829 m)   Wt 181 lb 9.6 oz (82.4 kg)   SpO2 97%   BMI 24.63 kg/m  Gen: NAD, resting comfortably CV: RRR no murmurs rubs or gallops Lungs: CTAB no crackles, wheeze, rhonchi Abdomen: soft/nontender/nondistended/normal bowel sounds.  Ext: no edema Skin: warm, dry     Assessment and Plan   #social update- now on day shift and he is excited about that.'s help decrease stress some  # Palpitations/gassy feeling in lower chest and upper abdomen S: Patient was seen on 03/31/2022  by Dr. Randol Kern for high heart rates and heart racing for several days before the visit with associated bubbling like or burning type sensation to his left chest wall and upper abdomen without shortness of breath.  His phone apparently had some readings concerning for possible atrial fibrillation with heart rate up to 150  EKG was reassuring, 48-hour cardiac monitor was ordered, troponin was not elevated, echocardiogram was ordered (pending), chest x-ray was ordered (not completed), CT calcium scoring was ordered (does not appear completed as had reassuring score of 0 on 12/25/2020).  TSH was normal  There was concern anxiety could be playing an element and he was given hydroxyzine.  He had been given escitalopram in the past which was helpful for the sensations- noted emotional restriction. Has not tried hydroxyzine yet.   He has also tried pepto bismol since that visit and gas x and finds that very helpful. Worse after eating. Gets near immediate relief- takes twice a day and prevents  Prior palpitations are much better- just had one day A/P: Palpitations are improving-he feels his anxiety has improved and has not even had to use hydroxyzine yet.  To be on the safe side has cardiac monitor on to make sure no more dangerous rhythms underlying -Anxiety could be contributing and he may try hydroxyzine but wants to hold  off on SSRI as felt rather emotionless  In regards to chest pain/gas sensation -Acid reflux can sometimes contribute to gassiness-can trial Pepcid before meals for 2 weeks and if does not work can stop and resume Gas-X or Pepto-Bismol tablet only - holding on x-ray for now unless symptoms fail to improve -cardiac monitoring pending -ct calcium scoring 18 months ago- he is still considering doing this and it is ordered-would likely give [place] most of the information needed from a chest x-ray -still needs echocardiogram-encouraged him to call cardiology to schedule follow-up  -As below  discussed quitting smoking will be one of the best things he can do for his cardiac health -he was placed on aspirin but not sure he will need that long-term  #hypertension S: medication: no rx - Lisinopril 10 mg in the past-has been started after work screening in the past. Weight loss was helpful and no longer needs (209 in 2018 and still 202.2 in march 2020).  BP Readings from Last 3 Encounters:  04/16/22 120/72  03/31/22 (!) 148/90  11/26/21 130/68  A/P: stable- continue current medicines    #hyperlipidemia S: Medication:none - he stopped taking  - last visit was atorvastatin 40 mg 3x a week- chest fluttering and rosuvastatin once a week caused dizziness -started back walking 3 sessions, eating pretty clean as of last 3 weeks The 10-year ASCVD risk score (Arnett DK, et al., 2019) is: 7.4%  Lab Results  Component Value Date   CHOL 271 (H) 03/31/2022   HDL 46.50 03/31/2022   LDLCALC 170 (H) 06/02/2021   LDLDIRECT 191.0 03/31/2022   TRIG 222.0 (H) 03/31/2022   CHOLHDL 6 03/31/2022   A/P: Very elevated with LDL over 190-discussed statin but he wants to hold off.  With LDL over 190 discussed option of PCSK9 inhibitor as well but he wants to hold off unless there are underlying cardiac issues  #Smoking- 3-5 per day- continues to cut down- encouraged full cessation- best thing he can do right now for his heart health   # Hyperglycemia/insulin resistance/prediabetes S:  Medication: none Lab Results  Component Value Date   HGBA1C 5.8 06/02/2021    A/P: Patient due for A1c but opted to do blood work altogether when he comes back in May  # Eczema S: Has seen dermatology in the past and been placed on steroid medication.  Long-term issue since childhood.  Currently getting patches of itchy dry skin on his chest that responded partially to Goldbond eczema cream but he is out of steroids previously prescribed by dermatology and this A/P: Eczema with poor control-discussed 7 to 10-day  trial of steroid cream covered with his Goldbond cream and follow-up with [place] with dermatology if fails to improve    No specialty comments available.  Recommended follow up: Return for next already scheduled visit or sooner if needed. Future Appointments  Date Time Provider [place]  04/29/2022 10:40 AM [facility] 2 [facility] [facility]  07/23/2022  1:00 PM Yong Channel Brayton Mars, MD [facility]    Lab/Order associations:   ICD-10-CM   1. Primary hypertension  I10     2. Eczema, unspecified type  L30.9     3. Current smoker  F17.200     4. Hyperlipidemia, unspecified hyperlipidemia type  E78.5     5. Palpitations  R00.2       Meds ordered this encounter  Medications   triamcinolone cream (KENALOG) 0.1 %    Sig: Apply 1 Application topically 2 (two)  times daily. For 7-10 days maximum    Dispense:  80 g    Refill:  4    Return precautions advised.  Tana Conch, MD

## 2022-04-23 ENCOUNTER — Telehealth (HOSPITAL_BASED_OUTPATIENT_CLINIC_OR_DEPARTMENT_OTHER): Payer: Self-pay | Admitting: *Deleted

## 2022-04-23 NOTE — Telephone Encounter (Signed)
Left message for patient to call and discuss scheduling the Echocardiogram ordered by Dr. Randol Kern

## 2022-04-24 ENCOUNTER — Other Ambulatory Visit: Payer: Self-pay | Admitting: Internal Medicine

## 2022-04-24 DIAGNOSIS — F419 Anxiety disorder, unspecified: Secondary | ICD-10-CM

## 2022-04-28 NOTE — Telephone Encounter (Signed)
Left message for patient to call and discuss scheduling the Echocardiogram ordered by Dr. Berniece Pap

## 2022-04-29 ENCOUNTER — Other Ambulatory Visit: Payer: 59

## 2022-05-03 ENCOUNTER — Other Ambulatory Visit: Payer: 59

## 2022-05-18 ENCOUNTER — Ambulatory Visit (INDEPENDENT_AMBULATORY_CARE_PROVIDER_SITE_OTHER): Payer: 59

## 2022-05-18 DIAGNOSIS — R079 Chest pain, unspecified: Secondary | ICD-10-CM | POA: Diagnosis not present

## 2022-05-19 LAB — ECHOCARDIOGRAM COMPLETE
Area-P 1/2: 3.27 cm2
S' Lateral: 3.39 cm

## 2022-05-27 ENCOUNTER — Encounter: Payer: 59 | Admitting: Family Medicine

## 2022-06-12 ENCOUNTER — Other Ambulatory Visit: Payer: Self-pay | Admitting: Family Medicine

## 2022-06-13 ENCOUNTER — Other Ambulatory Visit: Payer: Self-pay | Admitting: Family Medicine

## 2022-06-15 ENCOUNTER — Other Ambulatory Visit: Payer: Self-pay | Admitting: Internal Medicine

## 2022-06-15 DIAGNOSIS — F419 Anxiety disorder, unspecified: Secondary | ICD-10-CM

## 2022-07-08 ENCOUNTER — Encounter: Payer: Self-pay | Admitting: Family Medicine

## 2022-07-08 ENCOUNTER — Ambulatory Visit (INDEPENDENT_AMBULATORY_CARE_PROVIDER_SITE_OTHER): Payer: 59 | Admitting: Family Medicine

## 2022-07-08 VITALS — BP 126/78 | HR 70 | Temp 97.9°F | Ht 72.0 in | Wt 179.6 lb

## 2022-07-08 DIAGNOSIS — E785 Hyperlipidemia, unspecified: Secondary | ICD-10-CM

## 2022-07-08 DIAGNOSIS — Z0000 Encounter for general adult medical examination without abnormal findings: Secondary | ICD-10-CM

## 2022-07-08 DIAGNOSIS — Z125 Encounter for screening for malignant neoplasm of prostate: Secondary | ICD-10-CM | POA: Diagnosis not present

## 2022-07-08 DIAGNOSIS — Z131 Encounter for screening for diabetes mellitus: Secondary | ICD-10-CM | POA: Diagnosis not present

## 2022-07-08 DIAGNOSIS — R739 Hyperglycemia, unspecified: Secondary | ICD-10-CM | POA: Diagnosis not present

## 2022-07-08 DIAGNOSIS — F17200 Nicotine dependence, unspecified, uncomplicated: Secondary | ICD-10-CM | POA: Diagnosis not present

## 2022-07-08 LAB — LIPID PANEL
Cholesterol: 236 mg/dL — ABNORMAL HIGH (ref 0–200)
HDL: 46.7 mg/dL (ref >39.00)
LDL Cholesterol: 167 mg/dL — ABNORMAL HIGH (ref 0–99)
NonHDL: 188.95
Total CHOL/HDL Ratio: 5
Triglycerides: 109 mg/dL (ref 0.0–149.0)
VLDL: 21.8 mg/dL (ref 0.0–40.0)

## 2022-07-08 LAB — CBC WITH DIFFERENTIAL/PLATELET
Basophils Absolute: 0 10*3/uL (ref 0.0–0.1)
Basophils Relative: 0.8 % (ref 0.0–3.0)
Eosinophils Absolute: 0.1 10*3/uL (ref 0.0–0.7)
Eosinophils Relative: 2.1 % (ref 0.0–5.0)
HCT: 47 % (ref 39.0–52.0)
Hemoglobin: 16 g/dL (ref 13.0–17.0)
Lymphocytes Relative: 26.7 % (ref 12.0–46.0)
Lymphs Abs: 1.4 10*3/uL (ref 0.7–4.0)
MCHC: 34 g/dL (ref 30.0–36.0)
MCV: 86.8 fl (ref 78.0–100.0)
Monocytes Absolute: 0.7 10*3/uL (ref 0.1–1.0)
Monocytes Relative: 13.6 % — ABNORMAL HIGH (ref 3.0–12.0)
Neutro Abs: 2.9 10*3/uL (ref 1.4–7.7)
Neutrophils Relative %: 56.8 % (ref 43.0–77.0)
Platelets: 310 10*3/uL (ref 150.0–400.0)
RBC: 5.41 Mil/uL (ref 4.22–5.81)
RDW: 14.4 % (ref 11.5–15.5)
WBC: 5.1 10*3/uL (ref 4.0–10.5)

## 2022-07-08 LAB — URINALYSIS, ROUTINE W REFLEX MICROSCOPIC
Bilirubin Urine: NEGATIVE
Hgb urine dipstick: NEGATIVE
Ketones, ur: NEGATIVE
Leukocytes,Ua: NEGATIVE
Nitrite: NEGATIVE
RBC / HPF: NONE SEEN (ref 0–?)
Specific Gravity, Urine: 1.01 (ref 1.000–1.030)
Total Protein, Urine: NEGATIVE
Urine Glucose: NEGATIVE
Urobilinogen, UA: 0.2 (ref 0.0–1.0)
pH: 7 (ref 5.0–8.0)

## 2022-07-08 LAB — COMPREHENSIVE METABOLIC PANEL
ALT: 35 U/L (ref 0–53)
AST: 19 U/L (ref 0–37)
Albumin: 4.8 g/dL (ref 3.5–5.2)
Alkaline Phosphatase: 55 U/L (ref 39–117)
BUN: 16 mg/dL (ref 6–23)
CO2: 31 mEq/L (ref 19–32)
Calcium: 9.8 mg/dL (ref 8.4–10.5)
Chloride: 100 mEq/L (ref 96–112)
Creatinine, Ser: 0.95 mg/dL (ref 0.40–1.50)
GFR: 95.7 mL/min (ref >60.00)
Glucose, Bld: 94 mg/dL (ref 70–99)
Potassium: 4.3 mEq/L (ref 3.5–5.1)
Sodium: 139 mEq/L (ref 135–145)
Total Bilirubin: 0.6 mg/dL (ref 0.2–1.2)
Total Protein: 6.9 g/dL (ref 6.0–8.3)

## 2022-07-08 LAB — HEMOGLOBIN A1C: Hgb A1c MFr Bld: 5.9 % (ref 4.6–6.5)

## 2022-07-08 LAB — PSA: PSA: 3.12 ng/mL (ref 0.10–4.00)

## 2022-07-08 NOTE — Patient Instructions (Addendum)
Schedule eye doctor follow up   No changes today unless labs lead [place] to make changes  As always - QUIT SMOKING!   Please stop by lab before you go If you have mychart- we will send your results within 3 business days of [place] receiving them.  If you do not have mychart- we will call you about results within 5 business days of [place] receiving them.  *please also note that you will see labs on mychart as soon as they post. I will later go in and write notes on them- will say "notes from Dr. Durene Cal"   Recommended follow up: Return in about 5 months (around 12/08/2022) for followup or sooner if needed.Schedule b4 you leave.

## 2022-07-08 NOTE — Progress Notes (Signed)
Phone: [phone]    Subjective:  Patient presents today for their annual physical. Chief complaint-noted.   See problem oriented charting- ROS- full  review of systems was completed and negative  except for: some vision change in last year, occasional lightheadedness- knows needs to hydrate better, mild weight loss but eating healthier  The following were reviewed and entered/updated in epic: Past Medical History:  Diagnosis Date   Chicken pox    Hypertension    diagnosed at work screenings   Kidney stone    around age 47, peed them out   Patient Active Problem List   Diagnosis Date Noted   Current smoker 11/20/2014    Priority: High   Palpitations 03/31/2022    Priority: Medium    Eczema 05/22/2018    Priority: Medium    GAD (generalized anxiety disorder) 10/01/2017    Priority: Medium    Atypical chest pain 07/10/2015    Priority: Medium    Hyperlipidemia 03/06/2015    Priority: Medium    Hypertension     Priority: Medium    Plantar fibromatosis 11/22/2018    Priority: Low   Past Surgical History:  Procedure Laterality Date   HERNIA REPAIR     around 25, bilateral    Family History  Problem Relation Age of Onset   Stroke Mother        early 57s, drop foot   Hypertension Mother    Diabetes Mother    Hypertension Brother    Breast cancer Maternal Aunt     Medications- reviewed and updated Current Outpatient Medications  Medication Sig Dispense Refill   aspirin EC 81 MG tablet Take 1 tablet (81 mg total) by mouth daily. Swallow whole. 90 tablet 3   hydrOXYzine (ATARAX) 25 MG tablet Take 1 tablet (25 mg total) by mouth 2 (two) times daily as needed for anxiety (insomnia). Take one 25 mg tablet 30-60 minutes prior to bedtime for insomnia, anxiety. May increase to two tablets. 180 tablet 1   triamcinolone cream (KENALOG) 0.1 % Apply 1 Application topically 2 (two) times daily. For 7-10 days maximum 80 g 4   No current facility-administered medications for  this visit.    Allergies-reviewed and updated No Known Allergies  Social History   Social History Narrative   Family: Single. Girlfriend and 3 kids (older daughter plus 2 with girlfriend). 4 people in home. Son that lives in [place]- good situation with former partner.       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       Hobbies: football, basketball with son at park, bowl       Objective:  BP 126/78   Pulse 70   Temp 97.9 F (36.6 C) (Temporal)   Ht 6' (1.829 m)   Wt 179 lb 9.6 oz (81.5 kg)   SpO2 100%   BMI 24.36 kg/m  Gen: NAD, resting comfortably HEENT: Mucous membranes are moist. Oropharynx normal Neck: no thyromegaly CV: RRR no murmurs rubs or gallops Lungs: CTAB no crackles, wheeze, rhonchi Abdomen: soft/nontender/nondistended/normal bowel sounds. No rebound or guarding.  Ext: no edema Skin: warm, dry Neuro: grossly normal, moves all extremities, PERRLA    Assessment and Plan:  47 y.o. male presenting for annual physical.  Health Maintenance counseling: 1. Anticipatory guidance: Patient counseled regarding regular dental exams -q6 months, eye exams - has noted some vision  changes in last year but last seen a year ago- advised scheduling follow up ,  avoiding smoking and second hand smoke- see below , limiting alcohol to 2 beverages per day- 1-2 days drinking on weekend- up to 3-6 if off work, no illicit drugs.   2. Risk factor reduction:  Advised patient of need for regular exercise and diet rich and fruits and vegetables to reduce risk of heart attack and stroke.  Exercise- not exercising- doing 10 pushups a night but wants to intensify- goal 150 minutes a week.  Diet/weight management-has continued to eat healthy- mild weight loss- within 1 pounds of last year- has been intentional about healthy eating.  Wt Readings from Last 3 Encounters:  07/08/22  179 lb 9.6 oz (81.5 kg)  04/16/22 181 lb 9.6 oz (82.4 kg)  03/31/22 184 lb 3.2 oz (83.6 kg)  3. Immunizations/screenings/ancillary studies- declines COVID shot. Prevnar 20 in future if not able to quit smoking- but we believe in him to quit!  Immunization History  Administered Date(s) Administered   [employer] Vaccination 11/21/2019, 12/19/2019   Tdap 11/20/2014  4. Prostate cancer screening- got initial PSA last year- mid normal range- trend is more important than the # itself so monitor today  Lab Results  Component Value Date   PSA 2.21 06/02/2021   5. Colon cancer screening - cologuard 04/15/21 normal with 3 year repeat 6. Skin cancer screening/prevention- lower risk due to melanin content. Saw drematology for eczema about 6-7 months ago. advised regular sunscreen use. Denies worrisome, changing, or new skin lesions.  7. Testicular cancer screening- advised monthly self exams  8. STD screening- patient opts out - active only with long term girlfriend  9. Smoking associated screening-  smoker-last visit smoking 3 to 5 cigarettes/day- about 6 a day right now- encourged full cessation- get urinalysis . AAA screen after 65  Status of chronic or acute concerns   Social update-still enjoying dayshift   # Palpitations-last visit he reported these were improving and had not had to use hydroxyzine for anxiety.  We did a cardiac monitor but I do not see results from this from January- he is going to call them -he has been doing much better since shifting to dayshift- this was likely stress related  -She is preferred to hold off on SSRI - Also had some gassiness and we discussed trial of Pepcid in case reflux contributing or trial Gas-X or Pepto-Bismol - CT calcium was low risk 12/25/2020.  Reassuring echocardiogram May 18, 2022  #hypertension S: medication: No prescription.  Required lisinopril in the past but had some weight loss and blood pressure has trended down Home readings  #s: 117/76 most recently BP Readings from Last 3 Encounters:  07/08/22 126/78  04/16/22 120/72  03/31/22 (!) 148/90  A/P: Reasonably controlled on no medicine-continue to monitor without meds  #hyperlipidemia S: Medication: Previously prescribed statin but he stopped on his own Lab Results  Component Value Date   CHOL 271 (H) 03/31/2022   HDL 46.50 03/31/2022   LDLCALC 170 (H) 06/02/2021   LDLDIRECT 191.0 03/31/2022   TRIG 222.0 (H) 03/31/2022   CHOLHDL 6 03/31/2022   A/P: -Statin remains a great choice for him-he prefers to hold off - has been trying to eat well and wants to repeat even if some charge for lab    # Hyperglycemia/insulin resistance/prediabetes S:  Medication: none Exercise and diet- see above Lab Results  Component Value Date   HGBA1C 5.8 06/02/2021  A/P: hopefully  stable- update a1c today. Continue without meds for now   # Eczema-diagnosed by dermatology in the past-last visit we discussed using steroid cream up to 7 to 10 days with flares and then covering with Goldbond and seeing dermatology back if fail to improve- has improved  Recommended follow up: Return in about 5 months (around 12/08/2022) for followup or sooner if needed.Schedule b4 you leave.  Lab/Order associations: fasting   ICD-10-CM   1. Preventative health care  Z00.00     2. Hyperlipidemia, unspecified hyperlipidemia type  E78.5     3. Screening for diabetes mellitus  Z13.1     4. Hyperglycemia  R73.9     5. Current smoker  F17.200     6. Screening for prostate cancer  Z12.5       No orders of the defined types were placed in this encounter.   Return precautions advised.   Tana Conch, MD

## 2022-07-09 ENCOUNTER — Other Ambulatory Visit: Payer: Self-pay | Admitting: *Deleted

## 2022-07-09 DIAGNOSIS — R972 Elevated prostate specific antigen [PSA]: Secondary | ICD-10-CM

## 2022-07-23 ENCOUNTER — Encounter: Payer: 59 | Admitting: Family Medicine

## 2022-11-04 ENCOUNTER — Encounter (INDEPENDENT_AMBULATORY_CARE_PROVIDER_SITE_OTHER): Payer: Self-pay

## 2022-11-25 ENCOUNTER — Ambulatory Visit: Payer: 59 | Admitting: Family Medicine

## 2022-11-25 ENCOUNTER — Encounter: Payer: Self-pay | Admitting: Family Medicine

## 2022-11-25 VITALS — BP 122/70 | HR 72 | Temp 97.2°F | Ht 72.0 in | Wt 186.0 lb

## 2022-11-25 DIAGNOSIS — I10 Essential (primary) hypertension: Secondary | ICD-10-CM

## 2022-11-25 DIAGNOSIS — F17200 Nicotine dependence, unspecified, uncomplicated: Secondary | ICD-10-CM

## 2022-11-25 DIAGNOSIS — R739 Hyperglycemia, unspecified: Secondary | ICD-10-CM

## 2022-11-25 DIAGNOSIS — Z131 Encounter for screening for diabetes mellitus: Secondary | ICD-10-CM

## 2022-11-25 DIAGNOSIS — E785 Hyperlipidemia, unspecified: Secondary | ICD-10-CM

## 2022-11-25 DIAGNOSIS — R972 Elevated prostate specific antigen [PSA]: Secondary | ICD-10-CM | POA: Diagnosis not present

## 2022-11-25 LAB — COMPREHENSIVE METABOLIC PANEL
ALT: 35 U/L (ref 0–53)
AST: 20 U/L (ref 0–37)
Albumin: 4.6 g/dL (ref 3.5–5.2)
Alkaline Phosphatase: 54 U/L (ref 39–117)
BUN: 17 mg/dL (ref 6–23)
CO2: 30 meq/L (ref 19–32)
Calcium: 9.7 mg/dL (ref 8.4–10.5)
Chloride: 103 meq/L (ref 96–112)
Creatinine, Ser: 1 mg/dL (ref 0.40–1.50)
GFR: 89.74 mL/min (ref >60.00)
Glucose, Bld: 92 mg/dL (ref 70–99)
Potassium: 4.6 meq/L (ref 3.5–5.1)
Sodium: 139 meq/L (ref 135–145)
Total Bilirubin: 0.7 mg/dL (ref 0.2–1.2)
Total Protein: 6.9 g/dL (ref 6.0–8.3)

## 2022-11-25 LAB — LDL CHOLESTEROL, DIRECT: Direct LDL: 221 mg/dL

## 2022-11-25 LAB — HEMOGLOBIN A1C: Hgb A1c MFr Bld: 5.9 % (ref 4.6–6.5)

## 2022-11-25 LAB — PSA: PSA: 3.11 ng/mL (ref 0.10–4.00)

## 2022-11-25 NOTE — Patient Instructions (Addendum)
Please stop by lab before you go If you have mychart- we will send your results within 3 business days of [place] receiving them.  If you do not have mychart- we will call you about results within 5 business days of [place] receiving them.  *please also note that you will see labs on mychart as soon as they post. I will later go in and write notes on them- will say "notes from Dr. Durene Cal"   Recommended follow up: Return in about 6 months (around 05/25/2023) for physical or sooner if needed.Schedule b4 you leave.

## 2022-11-25 NOTE — Progress Notes (Signed)
Phone [phone] In person visit   Subjective:   Daryl Pace is a 47 y.o. year old very pleasant male patient who presents for/with See problem oriented charting Chief Complaint  Patient presents with   Medical Management of Chronic Issues   Hypertension         Past Medical History-  Patient Active Problem List   Diagnosis Date Noted   Current smoker 11/20/2014    Priority: High   Palpitations 03/31/2022    Priority: Medium    Eczema 05/22/2018    Priority: Medium    GAD (generalized anxiety disorder) 10/01/2017    Priority: Medium    Atypical chest pain 07/10/2015    Priority: Medium    Hyperlipidemia 03/06/2015    Priority: Medium    Hypertension     Priority: Medium    Plantar fibromatosis 11/22/2018    Priority: Low    Medications- reviewed and updated Current Outpatient Medications  Medication Sig Dispense Refill   aspirin EC 81 MG tablet Take 1 tablet (81 mg total) by mouth daily. Swallow whole. (Patient not taking: Reported on 11/25/2022) 90 tablet 3   hydrOXYzine (ATARAX) 25 MG tablet Take 1 tablet (25 mg total) by mouth 2 (two) times daily as needed for anxiety (insomnia). Take one 25 mg tablet 30-60 minutes prior to bedtime for insomnia, anxiety. May increase to two tablets. (Patient not taking: Reported on 11/25/2022) 180 tablet 1   triamcinolone cream (KENALOG) 0.1 % Apply 1 Application topically 2 (two) times daily. For 7-10 days maximum (Patient not taking: Reported on 11/25/2022) 80 g 4   No current facility-administered medications for this visit.     Objective:  BP 122/70   Pulse 72   Temp (!) 97.2 F (36.2 C)   Ht 6' (1.829 m)   Wt 186 lb (84.4 kg)   SpO2 97%   BMI 25.23 kg/m  Gen: NAD, resting comfortably CV: RRR no murmurs rubs or gallops Lungs: CTAB no crackles, wheeze, rhonchi Ext: no edema Skin: warm, dry     Assessment and Plan   # Palpitations-these have improved since moving to dayshift at last visit.  We had ordered a  cardiac monitor previously but results were pending-I do not see formal results available still- he states he sent this in male and got letter that he never got it -TSH normal in January 2024 - Thought to have some anxiety element and hydroxyzine helped some and had considered SSRI but he wanted to hold off. - CT calcium was low risk 12/25/2020 and reassuring echocardiogram May 18, 2022 -reports no palpitations at this point- does not want to look into this further as of now- if recurrent could simply refer to cardiology since we have had a hard time with testing   #generalized anxiety disorder- has hydroxyzine available as needed- has been better in general but this recent info that will need biopsy has been stressful- he may trial half tablet hydroxyzine or whole if needed  #hypertension S: medication: None/diet controlled - Had required lisinopril in the past but had weight loss and blood pressure improved BP Readings from Last 3 Encounters:  11/25/22 122/70  07/08/22 126/78  04/16/22 120/72  A/P: blood pressure looks good even with weight gain- stay off medicine for now  -plans to restart exercise  #hyperlipidemia-CT calcium scoring of 0 on 12/25/20 S: Medication:Previously prescribed statin but he stopped on his own Lab Results  Component Value Date   CHOL 236 (H) 07/08/2022   HDL  46.70 07/08/2022   LDLCALC 167 (H) 07/08/2022   LDLDIRECT 191.0 03/31/2022   TRIG 109.0 07/08/2022   CHOLHDL 5 07/08/2022  A/P: cholesterol has been above goal in past and even as high as 191   # Hyperglycemia/insulin resistance/prediabetes-peak A1c of 5.9 in 2024 S:  Medication: none Exercise and diet- up 7 lbs from last visit- has been eating poorly lately Lab Results  Component Value Date   HGBA1C 5.9 07/08/2022   HGBA1C 5.8 06/02/2021   A/P: hopefully stable or improved- update a1c today. Continue without meds for now - feels could cut down on unhealthy carbohydrates  # Eczema-diagnosed  by dermatology in the past-we have prescribed steroid cream in the past and he uses Goldbond between flares- some dryness - feels needs to get back on the goldbond  # Elevated PSA-referred to urology after last visit-they have recommended biopsy as has continued to trend up. He is nervous about procedure- wants to do another PSA today (did not hear back about last PSA from them and prefers [place] to check over calling back)   #Current smoker- discussed cessation today- he plans to try cold [place]. Down to 5-6 per day.   Recommended follow up: Return in about 6 months (around 05/25/2023) for physical or sooner if needed.Schedule b4 you leave.  Lab/Order associations:   ICD-10-CM   1. Primary hypertension  I10 Comprehensive metabolic panel    2. Hyperlipidemia, unspecified hyperlipidemia type  E78.5 Comprehensive metabolic panel    LDL cholesterol, direct    3. Current smoker  F17.200     4. Hyperglycemia  R73.9 Hemoglobin A1c    5. Screening for diabetes mellitus  Z13.1 Hemoglobin A1c    6. Elevated PSA  R97.20 PSA     No orders of the defined types were placed in this encounter.  Return precautions advised.  Tana Conch, MD

## 2022-12-10 ENCOUNTER — Other Ambulatory Visit: Payer: Self-pay | Admitting: Family Medicine

## 2022-12-17 ENCOUNTER — Other Ambulatory Visit: Payer: Self-pay | Admitting: Internal Medicine

## 2022-12-17 DIAGNOSIS — F419 Anxiety disorder, unspecified: Secondary | ICD-10-CM

## 2023-07-03 ENCOUNTER — Emergency Department (HOSPITAL_COMMUNITY)

## 2023-07-03 ENCOUNTER — Emergency Department (HOSPITAL_COMMUNITY)
Admission: EM | Admit: 2023-07-03 | Discharge: 2023-07-03 | Disposition: A | Discharge status: Home / Self Care | Attending: Emergency Medicine | Admitting: Emergency Medicine

## 2023-07-03 ENCOUNTER — Other Ambulatory Visit: Payer: Self-pay

## 2023-07-03 ENCOUNTER — Encounter (HOSPITAL_COMMUNITY): Payer: Self-pay | Admitting: Emergency Medicine

## 2023-07-03 DIAGNOSIS — I10 Essential (primary) hypertension: Secondary | ICD-10-CM | POA: Diagnosis not present

## 2023-07-03 DIAGNOSIS — K219 Gastro-esophageal reflux disease without esophagitis: Secondary | ICD-10-CM | POA: Insufficient documentation

## 2023-07-03 DIAGNOSIS — Z79899 Other long term (current) drug therapy: Secondary | ICD-10-CM | POA: Diagnosis not present

## 2023-07-03 DIAGNOSIS — Z720 Tobacco use: Secondary | ICD-10-CM | POA: Insufficient documentation

## 2023-07-03 DIAGNOSIS — Z7982 Long term (current) use of aspirin: Secondary | ICD-10-CM | POA: Diagnosis not present

## 2023-07-03 DIAGNOSIS — R0789 Other chest pain: Secondary | ICD-10-CM

## 2023-07-03 DIAGNOSIS — R079 Chest pain, unspecified: Secondary | ICD-10-CM | POA: Diagnosis present

## 2023-07-03 LAB — CBC
HCT: 50.7 % (ref 39.0–52.0)
Hemoglobin: 16.6 g/dL (ref 13.0–17.0)
MCH: 29.1 pg (ref 26.0–34.0)
MCHC: 32.7 g/dL (ref 30.0–36.0)
MCV: 88.8 fL (ref 80.0–100.0)
Platelets: 323 10*3/uL (ref 150–400)
RBC: 5.71 MIL/uL (ref 4.22–5.81)
RDW: 13.5 % (ref 11.5–15.5)
WBC: 5.2 10*3/uL (ref 4.0–10.5)
nRBC: 0 % (ref 0.0–0.2)

## 2023-07-03 LAB — BASIC METABOLIC PANEL WITH GFR
Anion gap: 9 (ref 5–15)
BUN: 15 mg/dL (ref 6–20)
CO2: 25 mmol/L (ref 22–32)
Calcium: 9.5 mg/dL (ref 8.9–10.3)
Chloride: 103 mmol/L (ref 98–111)
Creatinine, Ser: 1.03 mg/dL (ref 0.61–1.24)
GFR, Estimated: >60 mL/min (ref >60)
Glucose, Bld: 109 mg/dL — ABNORMAL HIGH (ref 70–99)
Potassium: 3.8 mmol/L (ref 3.5–5.1)
Sodium: 137 mmol/L (ref 135–145)

## 2023-07-03 LAB — TROPONIN I (HIGH SENSITIVITY): Troponin I (High Sensitivity): <2 ng/L (ref <18)

## 2023-07-03 MED ORDER — PANTOPRAZOLE SODIUM 40 MG PO TBEC: 40.0000 mg | DELAYED_RELEASE_TABLET | Freq: Every day | ORAL | 2 refills | Status: DC

## 2023-07-03 MED ORDER — SUCRALFATE 1 G PO TABS: 1.0000 g | ORAL_TABLET | Freq: Three times a day (TID) | ORAL | 2 refills | Status: DC

## 2023-07-03 NOTE — ED Triage Notes (Signed)
 Patient comes in for chest pain that started 5 to 6 days ago. Gas pills did give relief but it returns.

## 2023-07-03 NOTE — Discharge Instructions (Signed)
 Your workup today was reassuring, I do not see any evidence of any heart attack or other heart or lung pathology to explain your symptoms, based on your story and responses to home medications I have a high clinical suspicion of acid reflux, possible developing gastritis, gastric ulcer, versus other stomach or esophagus pathology.  I have placed ambulatory referral to cardiology who should give you a call to schedule a stress test if you continue to have persistent symptoms despite treatment, please follow-up with your primary care doctor at your convenience, I have also given the contact formation for a gastroenterologist that can help to manage the acid reflux if it is not improved with the medications we have prescribed.

## 2023-07-03 NOTE — ED Provider Notes (Signed)
 [name] Provider Note   CSN: [id number] Arrival date & time: 07/03/23  1443     History  Chief Complaint  Patient presents with   Chest Pain    Daryl Pace is a 48 y.o. male with Past medical history seen for hypertension, hyperlipidemia, anxiety, tobacco use who presents with concern for chest pain intermittently for the last 5 to 6 days.  Patient reports pain worse with eating, worse with laying, not worse with exertion, not associated with shortness of breath.  No previous history of ACS, CAD, no previous stress test or echo.  He denies any recent travel, calf swelling.  He reports pain worse with belching.  He is had relief with Gas-X, pepto bismol, Tums.   Chest Pain      Home Medications Prior to Admission medications   Medication Sig Start Date End Date Taking? Authorizing Provider  pantoprazole (PROTONIX) 40 MG tablet Take 1 tablet (40 mg total) by mouth daily. 07/03/23  Yes Daryl Butson H, PA-C  sucralfate (CARAFATE) 1 g tablet Take 1 tablet (1 g total) by mouth 4 (four) times daily -  with meals and at bedtime. 07/03/23  Yes Daryl Goodie H, PA-C  aspirin EC 81 MG tablet Take 1 tablet (81 mg total) by mouth daily. Swallow whole. Patient not taking: Reported on 11/25/2022 03/31/22   Daryl Kins, MD  hydrOXYzine (ATARAX) 25 MG tablet TAKE 1 TABLET (25 MG TOTAL) BY MOUTH 2 (TWO) TIMES DAILY AS NEEDED FOR ANXIETY (INSOMNIA). TAKE ONE 25 MG TABLET 30-60 MINUTES PRIOR TO BEDTIME FOR INSOMNIA, ANXIETY. MAY INCREASE TO TWO TABLETS. 12/17/22   Daryl Kins, MD  triamcinolone cream (KENALOG) 0.1 % Apply 1 Application topically 2 (two) times daily. For 7-10 days maximum Patient not taking: Reported on 11/25/2022 04/16/22   Daryl Jaeger, MD      Allergies    Patient has no known allergies.    Review of Systems   Review of Systems  Cardiovascular:  Positive for chest pain.  All other systems reviewed and  are negative.   Physical Exam Updated Vital Signs BP (!) 148/95 (BP Location: Left Arm)   Pulse 83   Temp 98.8 F (37.1 C) (Oral)   Resp 16   Ht 6' (1.829 m)   Wt 86.2 kg   SpO2 100%   BMI 25.77 kg/m  Physical Exam Vitals and nursing note reviewed.  Constitutional:      General: He is not in acute distress.    Appearance: Normal appearance.  HENT:     Head: Normocephalic and atraumatic.  Eyes:     General:        Right eye: No discharge.        Left eye: No discharge.  Cardiovascular:     Rate and Rhythm: Normal rate and regular rhythm.     Heart sounds: No murmur heard.    No friction rub. No gallop.  Pulmonary:     Effort: Pulmonary effort is normal.     Breath sounds: Normal breath sounds.  Chest:     Comments: Mild tenderness to palpation just inferior to the sternum on the left no other focal chest tenderness Abdominal:     General: Bowel sounds are normal.     Palpations: Abdomen is soft.  Skin:    General: Skin is warm and dry.     Capillary Refill: Capillary refill takes less than 2 seconds.  Neurological:  Mental Status: He is alert and oriented to person, place, and time.  Psychiatric:        Mood and Affect: Mood normal.        Behavior: Behavior normal.     ED Results / Procedures / Treatments   Labs (all labs ordered are listed, but only abnormal results are displayed) Labs Reviewed  BASIC METABOLIC PANEL WITH GFR - Abnormal; Notable for the following components:      Result Value   Glucose, Bld 109 (*)    All other components within normal limits  CBC  TROPONIN I (HIGH SENSITIVITY)    EKG None  Radiology DG Chest 2 View Result Date: 07/03/2023 CLINICAL DATA:  chest pain EXAM: CHEST - 2 VIEW COMPARISON:  September 06, 2017 FINDINGS: No focal airspace consolidation, pleural effusion, or pneumothorax. No cardiomegaly. No acute fracture or destructive lesion. IMPRESSION: No acute cardiopulmonary abnormality. Electronically Signed   By: Daryl Pace M.D.   On: 07/03/2023 15:55    Procedures Procedures    Medications Ordered in ED Medications - No data to display  ED Course/ Medical Decision Making/ A&P                                 Medical Decision Making Amount and/or Complexity of Data Reviewed Labs: ordered. Radiology: ordered.   This patient is a 48 y.o. male  who presents to the ED for concern of chest pain, belching.   Differential diagnoses prior to evaluation: The emergent differential diagnosis includes, but is not limited to,  ACS, AAS, PE, Mallory-Weiss, Boerhaave's, Pneumonia, acute bronchitis, asthma or COPD exacerbation, anxiety, MSK pain or traumatic injury to the chest, acid reflux versus other . This is not an exhaustive differential.   Past Medical History / Co-morbidities / Social History: Hypertension, hyperlipidemia, tobacco abuse, anxiety  Additional history: Chart reviewed. Pertinent results include: Reviewed outpatient family medicine visits including labs, imaging  Physical Exam: Physical exam performed. The pertinent findings include: Vital signs overall stable other than some mild hypertension, blood pressure 148/95, normal heart rate and rhythm throughout, very mild epigastric to left upper quadrant tenderness.  No significant chest wall tenderness.  Lab Tests/Imaging studies: I personally interpreted labs/imaging and the pertinent results include: CBC unremarkable, BMP unremarkable, troponin undetectable in context of chest pain ongoing intermittently for 5 to 6 days.  I independently interpreted plain film chest x-ray which shows no evidence of acute intrathoracic abnormality.. I agree with the radiologist interpretation.  Cardiac monitoring: EKG obtained and interpreted by myself and attending physician which shows: Sinus rhythm, nonspecific ST elevation suspect early repull, no acute T wave changes, does not meet STEMI criteria.   Medications: Discharge with Protonix, Carafate,  encouraged ambulatory referral to cardiology, GI follow-up, overall with high clinical sufficient for gastritis, esophagitis, versus stomach ulcer, no evidence of acute cardiac pathology.  Heart score of 1 just secondary to his cardiac risk factors but story atypical, workup reassuring, and chest pain-free at time of discharge.   Discussed smoking cessation with patient and was they were offerred resources to help stop.  Total time was 5 min CPT code 40981.   Disposition: After consideration of the diagnostic results and the patients response to treatment, I feel that patient is stable for discharge with plan as above .   emergency department workup does not suggest an emergent condition requiring admission or immediate intervention beyond what has been performed at  this time. The plan is: as above. The patient is safe for discharge and has been instructed to return immediately for worsening symptoms, change in symptoms or any other concerns.  Final Clinical Impression(s) / ED Diagnoses Final diagnoses:  Atypical chest pain  Gastroesophageal reflux disease, unspecified whether esophagitis present  Tobacco use    Rx / DC Orders ED Discharge Orders          Ordered    pantoprazole (PROTONIX) 40 MG tablet  Daily        07/03/23 1708    sucralfate (CARAFATE) 1 g tablet  3 times daily with meals & bedtime        07/03/23 1708    Ambulatory referral to Cardiology        07/03/23 [address] [place], PA-C 07/03/23 1715    Hershel Los, MD 07/03/23 2317

## 2023-07-12 ENCOUNTER — Ambulatory Visit (INDEPENDENT_AMBULATORY_CARE_PROVIDER_SITE_OTHER): Payer: 59 | Admitting: Family Medicine

## 2023-07-12 ENCOUNTER — Encounter: Payer: Self-pay | Admitting: Family Medicine

## 2023-07-12 VITALS — BP 138/78 | HR 91 | Temp 97.7°F | Ht 72.0 in | Wt 186.6 lb

## 2023-07-12 DIAGNOSIS — E663 Overweight: Secondary | ICD-10-CM

## 2023-07-12 DIAGNOSIS — E785 Hyperlipidemia, unspecified: Secondary | ICD-10-CM | POA: Diagnosis not present

## 2023-07-12 DIAGNOSIS — F17200 Nicotine dependence, unspecified, uncomplicated: Secondary | ICD-10-CM | POA: Diagnosis not present

## 2023-07-12 DIAGNOSIS — C61 Malignant neoplasm of prostate: Secondary | ICD-10-CM

## 2023-07-12 DIAGNOSIS — Z0000 Encounter for general adult medical examination without abnormal findings: Secondary | ICD-10-CM

## 2023-07-12 DIAGNOSIS — I10 Essential (primary) hypertension: Secondary | ICD-10-CM | POA: Diagnosis not present

## 2023-07-12 DIAGNOSIS — Z131 Encounter for screening for diabetes mellitus: Secondary | ICD-10-CM | POA: Diagnosis not present

## 2023-07-12 LAB — URINALYSIS, ROUTINE W REFLEX MICROSCOPIC
Bilirubin Urine: NEGATIVE
Hgb urine dipstick: NEGATIVE
Ketones, ur: NEGATIVE
Leukocytes,Ua: NEGATIVE
Nitrite: NEGATIVE
RBC / HPF: NONE SEEN (ref 0–?)
Specific Gravity, Urine: 1.01 (ref 1.000–1.030)
Total Protein, Urine: NEGATIVE
Urine Glucose: NEGATIVE
Urobilinogen, UA: 0.2 (ref 0.0–1.0)
WBC, UA: NONE SEEN (ref 0–?)
pH: 6.5 (ref 5.0–8.0)

## 2023-07-12 LAB — HEMOGLOBIN A1C: Hgb A1c MFr Bld: 5.9 % (ref 4.6–6.5)

## 2023-07-12 LAB — CBC WITH DIFFERENTIAL/PLATELET
Basophils Absolute: 0 10*3/uL (ref 0.0–0.1)
Basophils Relative: 0.5 % (ref 0.0–3.0)
Eosinophils Absolute: 0.1 10*3/uL (ref 0.0–0.7)
Eosinophils Relative: 2.1 % (ref 0.0–5.0)
HCT: 50.6 % (ref 39.0–52.0)
Hemoglobin: 16.6 g/dL (ref 13.0–17.0)
Lymphocytes Relative: 27.1 % (ref 12.0–46.0)
Lymphs Abs: 1.3 10*3/uL (ref 0.7–4.0)
MCHC: 32.8 g/dL (ref 30.0–36.0)
MCV: 89.1 fl (ref 78.0–100.0)
Monocytes Absolute: 0.5 10*3/uL (ref 0.1–1.0)
Monocytes Relative: 10.9 % (ref 3.0–12.0)
Neutro Abs: 2.8 10*3/uL (ref 1.4–7.7)
Neutrophils Relative %: 59.4 % (ref 43.0–77.0)
Platelets: 283 10*3/uL (ref 150.0–400.0)
RBC: 5.68 Mil/uL (ref 4.22–5.81)
RDW: 13.9 % (ref 11.5–15.5)
WBC: 4.7 10*3/uL (ref 4.0–10.5)

## 2023-07-12 LAB — COMPREHENSIVE METABOLIC PANEL WITH GFR
ALT: 20 U/L (ref 0–53)
AST: 15 U/L (ref 0–37)
Albumin: 5 g/dL (ref 3.5–5.2)
Alkaline Phosphatase: 53 U/L (ref 39–117)
BUN: 15 mg/dL (ref 6–23)
CO2: 31 meq/L (ref 19–32)
Calcium: 10 mg/dL (ref 8.4–10.5)
Chloride: 99 meq/L (ref 96–112)
Creatinine, Ser: 1.1 mg/dL (ref 0.40–1.50)
GFR: 79.69 mL/min (ref >60.00)
Glucose, Bld: 104 mg/dL — ABNORMAL HIGH (ref 70–99)
Potassium: 4.9 meq/L (ref 3.5–5.1)
Sodium: 138 meq/L (ref 135–145)
Total Bilirubin: 0.7 mg/dL (ref 0.2–1.2)
Total Protein: 7.3 g/dL (ref 6.0–8.3)

## 2023-07-12 LAB — LIPID PANEL
Cholesterol: 288 mg/dL — ABNORMAL HIGH (ref 0–200)
HDL: 44.5 mg/dL (ref >39.00)
LDL Cholesterol: 199 mg/dL — ABNORMAL HIGH (ref 0–99)
NonHDL: 243
Total CHOL/HDL Ratio: 6
Triglycerides: 219 mg/dL — ABNORMAL HIGH (ref 0.0–149.0)
VLDL: 43.8 mg/dL — ABNORMAL HIGH (ref 0.0–40.0)

## 2023-07-12 LAB — PSA: PSA: 4.4 ng/mL — ABNORMAL HIGH (ref 0.10–4.00)

## 2023-07-12 NOTE — Progress Notes (Signed)
 Phone: [phone]    Subjective:  Patient presents today for their annual physical. Chief complaint-noted.   See problem oriented charting- ROS- full  review of systems was completed and negative  Per full ROS sheet completed by patient other than atypical chest pain , some vision changes over time  The following were reviewed and entered/updated in epic: Past Medical History:  Diagnosis Date   Chicken pox    Hypertension    diagnosed at work screenings   Kidney stone    around age 29, peed them out   Patient Active Problem List   Diagnosis Date Noted   Current smoker 11/20/2014    Priority: High   Palpitations 03/31/2022    Priority: Medium    Eczema 05/22/2018    Priority: Medium    GAD (generalized anxiety disorder) 10/01/2017    Priority: Medium    Atypical chest pain 07/10/2015    Priority: Medium    Hyperlipidemia 03/06/2015    Priority: Medium    Hypertension     Priority: Medium    Plantar fibromatosis 11/22/2018    Priority: Low   Past Surgical History:  Procedure Laterality Date   HERNIA REPAIR     around 25, bilateral    Family History  Problem Relation Age of Onset   Stroke Mother        early 101s, drop foot   Hypertension Mother    Diabetes Mother    Hypertension Brother    Breast cancer Maternal Aunt     Medications- reviewed and updated Current Outpatient Medications  Medication Sig Dispense Refill   aspirin  EC 81 MG tablet Take 1 tablet (81 mg total) by mouth daily. Swallow whole. 90 tablet 3   hydrOXYzine  (ATARAX ) 25 MG tablet TAKE 1 TABLET (25 MG TOTAL) BY MOUTH 2 (TWO) TIMES DAILY AS NEEDED FOR ANXIETY (INSOMNIA). TAKE ONE 25 MG TABLET 30-60 MINUTES PRIOR TO BEDTIME FOR INSOMNIA, ANXIETY. MAY INCREASE TO TWO TABLETS. 180 tablet 1   pantoprazole  (PROTONIX ) 40 MG tablet Take 1 tablet (40 mg total) by mouth daily. 30 tablet 2   sucralfate  (CARAFATE ) 1 g tablet Take 1 tablet (1 g total) by mouth 4 (four) times daily -  with meals and at  bedtime. 60 tablet 2   triamcinolone  cream (KENALOG ) 0.1 % Apply 1 Application topically 2 (two) times daily. For 7-10 days maximum 80 g 4   No current facility-administered medications for this visit.    Allergies-reviewed and updated No Known Allergies  Social History   Social History Narrative   Family: Single. Girlfriend and 3 kids (older daughter plus 2 with girlfriend). 4 people in home. Son that lives in [place]- good situation with former partner.       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       Hobbies: football, basketball with son at park, bowl       Objective:  BP 138/78   Pulse 91   Temp 97.7 F (36.5 C)   Ht 6' (1.829 m)   Wt 186 lb 9.6 oz (84.6 kg)   SpO2 98%   BMI 25.31 kg/m  Gen: NAD, resting comfortably HEENT: Mucous membranes are moist. Oropharynx normal Neck: no thyromegaly CV: RRR no murmurs rubs or gallops Lungs: CTAB no crackles, wheeze, rhonchi Abdomen: soft/nontender/nondistended/normal bowel sounds. No rebound or guarding.  Ext: no edema Skin: warm,  dry Neuro: grossly normal, moves all extremities, PERRLA    Assessment and Plan:  48 y.o. male presenting for annual physical.  Health Maintenance counseling: 1. Anticipatory guidance: Patient counseled regarding regular dental exams -q6 months, eye exams - plans to schedule visit,  avoiding smoking and second hand smoke- see below , limiting alcohol to 2 beverages per day - 4 beers 4-5 days a week- encouraged max 2 beers per day or less to reduce cancer risk, no illicit drugs.   2. Risk factor reduction:  Advised patient of need for regular exercise and diet rich and fruits and vegetables to reduce risk of heart attack and stroke.  Exercise- trying to walk in neighborhood 1 day a week- encouraged to increase after heart evaluated- does get 10000 steps at work.  Diet/weight  management-weight reasonably still .  Wt Readings from Last 3 Encounters:  07/12/23 186 lb 9.6 oz (84.6 kg)  07/03/23 190 lb (86.2 kg)  11/25/22 186 lb (84.4 kg)  3. Immunizations/screenings/ancillary studies- Prevnar 20 at later date- declines. Opts out covid Immunization History  Administered Date(s) Administered   Moderna Sars-Covid-2 Vaccination 11/21/2019, 12/19/2019   Tdap 11/20/2014  4. Prostate cancer screening- he has been referred to urology and they have recommended biopsy due to elevated PSA-he was diagnosed with prostate cancer but moderate volume low risk disease-he is now under active surveillance with Dr. Pinkey Brier Lab Results  Component Value Date   PSA 3.11 11/25/2022   PSA 3.12 07/08/2022   PSA 2.21 06/02/2021   5. Colon cancer screening - cologuard 04/15/21 with 3 year repeat 6. Skin cancer screening/prevention- lower risk due to melanin content. advised regular sunscreen use. Denies worrisome, changing, or new skin lesions.  7. Testicular cancer screening- advised monthly self exams  8. STD screening- patient opts out- only active with long term girlfriend  9. Smoking associated screening- current smoker-last visit was down to 5 to 6 cigarettes/day- encouraged full cessation. Get urinalysis    Status of chronic or acute concerns   # Atypical chest pain - Patient seen emergency room 07/03/2023 after 5 to 6 days of nonexertional chest pain that radiates to back that was associated with belching.  CBC and BMP unremarkable.  Troponin not elevated.  Chest x-ray with no acute abnormality.  EKG nonspecific ST elevation thought to be early repolarization - Thought to be reflux related and started on Protonix  and Carafate  and encouraged cardiology visit as well as GI follow-up - he is on protonix  but didn't start carafate - pain has improved but still gets some gastroenterology rumbling/upset. One day he missed and had salsa and symptoms worsen.  - referral already in place and  encouraged him to schedule with cardiology  - also if not better within a month could also see gastroenterology   # Palpitations-these have improved since moving to [place].   Dr. Boston Byers ordered a cardiac monitor previously but results never finalized-he sent it back in the mail but he got a letter that they never received it  -TSH normal in January 2024- check today - Thought to have some anxiety element and hydroxyzine  helped some and had considered SSRI but he wanted to hold off. - CT calcium  was low risk 12/25/2020 and reassuring echocardiogram May 18, 2022  -resolved as of 11/25/22- still no recurrence  #generalized anxiety disorder- has hydroxyzine  available as needed- not needing lately  #hypertension S: medication: None/diet controlled - Had required lisinopril  in the past but had weight loss and blood pressure improved -usually around  low 130s at home A/P: high acceptable - he prefers to stay off of medicine- quitting smoking may help  #hyperlipidemia-CT calcium  scoring of 0 on 12/25/20 S: Medication: Previously prescribed statin but he stopped on his own- felt dizzy -LDL up to 191 but down to 161'W with exercies Lab Results  Component Value Date   CHOL 236 (H) 07/08/2022   HDL 46.70 07/08/2022   LDLCALC 167 (H) 07/08/2022   LDLDIRECT 221.0 11/25/2022   TRIG 109.0 07/08/2022   CHOLHDL 5 07/08/2022  A/P: last LDL severely high- I think sitting down with cardiology to be preventative makes sense with high elevations   # Hyperglycemia/insulin resistance/prediabetes-peak A1c of 5.9 in 2024 S:  Medication: none Lab Results  Component Value Date   HGBA1C 5.9 11/25/2022   HGBA1C 5.9 07/08/2022   HGBA1C 5.8 06/02/2021  A/P: hopefully stable- update a1c today. Continue without meds for now   # Eczema-diagnosed by dermatology in the past.  We have prescribed steroid cream in the past and uses [facility] between flare   Recommended follow up: Return in about 6 months (around  01/11/2024) for followup or sooner if needed.Schedule b4 you leave.  Lab/Order associations: fasting   ICD-10-CM   1. Preventative health care  Z00.00     2. Screening for diabetes mellitus  Z13.1     3. Overweight  E66.3     4. Current smoker  F17.200     5. Hyperlipidemia, unspecified hyperlipidemia type  E78.5     6. Primary hypertension  I10     7. Prostate cancer (HCC)  C61      No orders of the defined types were placed in this encounter.  Return precautions advised.  Clarisa Crooked, MD

## 2023-07-12 NOTE — Patient Instructions (Addendum)
 Health Maintenance Due  Topic Date Due   Pneumococcal Vaccine 30-48 Years old (1 of 2 - PCV) Never done  Consider at later date since wanted to hold off today  Big message from today- quitting smoking and reducing alcohol can reduce your cancer risk!   Schedule eye doctor follow up   Please stop by lab before you go If you have mychart- we will send your results within 3 business days of us  receiving them.  If you do not have mychart- we will call you about results within 5 business days of us  receiving them.  *please also note that you will see labs on mychart as soon as they post. I will later go in and write notes on them- will say "notes from Dr. Arlene Ben"   Encourage cardiology follow up as referred by Emergency Department  [phone]  Recommended follow up: Return in about 6 months (around 01/11/2024) for followup or sooner if needed.Schedule b4 you leave.

## 2023-11-29 ENCOUNTER — Other Ambulatory Visit: Payer: Self-pay | Admitting: Urology

## 2023-11-29 DIAGNOSIS — C61 Malignant neoplasm of prostate: Secondary | ICD-10-CM

## 2023-11-30 ENCOUNTER — Encounter: Payer: Self-pay | Admitting: Urology

## 2023-12-05 NOTE — Progress Notes (Unsigned)
    Cardiology Office Note Date:  12/08/2023  ID:  Daryl Pace,  06/05/1975, MRN [id number] PCP:  Katrinka Garnette KIDD, MD  Cardiologist: Joelle VEAR Ren Donley, MD  No chief complaint on file.    Problems CP ASCVD risk 10.7% HTN HLD LDL 199, TG 219 4/25 HA1C 5.9 4/25 Tobacco use 7.8 pack year  Visits  9/18: CTCA, TTE, 7 day event monitor, RN10    History of Present Illness: Daryl Pace is a 48 y.o. male who presents for CP.  He has been having ongoing chest discomfort for about 6-7 months. He had presented to ED with CP in April and had extensive workup with no cause found. He was started on reflux medications with some improvement in his symptoms. He reports some feeling of his HR being up and down all the time and wants to know what's going on. He reported that about 2-3 years ago, he was having recurrent episodes of chest palpitations when he would be driving and had to pull over to the side. He had a similar episode this past weekend and has not been able to very long distances. He had tried anxiety med but had some side effects with it and had to stop.   ROS: Please see the history of present illness. All other systems are reviewed and negative.   Past Medical History:  Diagnosis Date   Chicken pox    Hypertension    diagnosed at work screenings   Kidney stone    around age 31, peed them out    Past Surgical History:  Procedure Laterality Date   HERNIA REPAIR     around 25, bilateral    Current Outpatient Medications  Medication Sig Dispense Refill   sucralfate  (CARAFATE ) 1 g tablet Take 1 tablet (1 g total) by mouth 4 (four) times daily -  with meals and at bedtime. 60 tablet 2   No current facility-administered medications for this visit.    Allergies:   Patient has no known allergies.   Social History:  10 pack year smoking Hx  Family History:  Noncontributory  PHYSICAL EXAM: VS:  BP (!) 120/92 (BP Location: Left Arm, Patient Position:  Sitting, Cuff Size: Normal)   Pulse (!) 110   Ht 6' (1.829 m)   Wt 179 lb (81.2 kg)   SpO2 97%   BMI 24.28 kg/m  , BMI Body mass index is 24.28 kg/m. GEN: Well nourished, well developed, in no acute distress HEENT: normal Neck: no JVD, carotid bruits, or masses Cardiac: Tachycardic but regular rhythm; no murmurs, rubs, or gallops,no edema  Respiratory:  CTAB bilaterally, normal work of breathing GI: soft, nontender, nondistended, + BS Extremities: No LE edema Skin: warm and dry, no rash Neuro:  Strength and sensation are intact  EKG: Sinus tachycardia 104  Recent Labs: Reviewed  Studies: Reviewed  ASSESSMENT AND PLAN: Daryl Pace is a 48 y.o. male who presents for CP.  #CP #HTN/HLD #Tobacco Use #Pre-DM - His CP sounds non-cardiac but given the significant emotional impact in his quality of life, will obtain coronary CTA and TTE for further evaluation. In regards to his palpitations, it is likely anxiety but will do event monitor to rule out any tachy-arrhythmias.  - Start RN10 given LDL > 190  - Follow up in 3 months to discuss results and then PRN  Signed, Joelle VEAR Ren Donley, MD  12/08/2023 3:23 PM     [facility]

## 2023-12-08 ENCOUNTER — Other Ambulatory Visit (HOSPITAL_COMMUNITY): Payer: Self-pay

## 2023-12-08 ENCOUNTER — Ambulatory Visit

## 2023-12-08 ENCOUNTER — Telehealth: Payer: Self-pay

## 2023-12-08 VITALS — BP 120/92 | HR 110 | Ht 72.0 in | Wt 179.0 lb

## 2023-12-08 DIAGNOSIS — F17200 Nicotine dependence, unspecified, uncomplicated: Secondary | ICD-10-CM

## 2023-12-08 DIAGNOSIS — I10 Essential (primary) hypertension: Secondary | ICD-10-CM

## 2023-12-08 DIAGNOSIS — E785 Hyperlipidemia, unspecified: Secondary | ICD-10-CM

## 2023-12-08 DIAGNOSIS — R000 Tachycardia, unspecified: Secondary | ICD-10-CM

## 2023-12-08 DIAGNOSIS — R7303 Prediabetes: Secondary | ICD-10-CM

## 2023-12-08 DIAGNOSIS — R0789 Other chest pain: Secondary | ICD-10-CM

## 2023-12-08 MED ORDER — METOPROLOL TARTRATE 100 MG PO TABS: ORAL_TABLET | ORAL | 0 refills | Status: DC

## 2023-12-08 MED ORDER — ROSUVASTATIN CALCIUM 10 MG PO TABS
10.0000 mg | ORAL_TABLET | Freq: Every day | ORAL | 3 refills | Status: DC
  Filled 2023-12-08: qty 30, 30d supply, fill #0

## 2023-12-08 MED ORDER — METOPROLOL TARTRATE 100 MG PO TABS
ORAL_TABLET | ORAL | 0 refills | Status: DC
  Filled 2023-12-08: qty 1, fill #0

## 2023-12-08 MED ORDER — ROSUVASTATIN CALCIUM 10 MG PO TABS
10.0000 mg | ORAL_TABLET | Freq: Every day | ORAL | Status: DC
Start: 2023-12-08 — End: 2023-12-08

## 2023-12-08 NOTE — Addendum Note (Signed)
 Addended by: GRETEL MAEOLA CROME on: 12/08/2023 03:56 PM   Modules accepted: Orders

## 2023-12-08 NOTE — Telephone Encounter (Signed)
 Patient has already spoken to [place], RN in regards to his appointment this afternoon. He has no further questions.

## 2023-12-08 NOTE — Progress Notes (Unsigned)
 Enrolled patient for a 7 day Zio XT monitor to be mailed to patients home.

## 2023-12-08 NOTE — Addendum Note (Signed)
 Addended by: GRETEL MAEOLA CROME on: 12/08/2023 03:48 PM   Modules accepted: Orders

## 2023-12-08 NOTE — Patient Instructions (Addendum)
 Medication Instructions:  Your physician has recommended you make the following change in your medication:  START Rosuvastatin  10 mg once daily   *If you need a refill on your cardiac medications before your next appointment, please call your pharmacy*  Lab Work: None ordered  If you have any lab test that is abnormal or we need to change your treatment, we will call you to review the results.  Testing/Procedures: Your physician has requested that you have an echocardiogram. Echocardiography is a painless test that uses sound waves to create images of your heart. It provides your doctor with information about the size and shape of your heart and how well your heart's chambers and valves are working. This procedure takes approximately one hour. There are no restrictions for this procedure. Please do NOT wear cologne, perfume, aftershave, or lotions (deodorant is allowed). Please arrive 15 minutes prior to your appointment time.  Please note: We ask at that you not bring children with you during ultrasound (echo/ vascular) testing. Due to room size and safety concerns, children are not allowed in the ultrasound rooms during exams. Our front office staff cannot provide observation of children in our lobby area while testing is being conducted. An adult accompanying a patient to their appointment will only be allowed in the ultrasound room at the discretion of the ultrasound technician under special circumstances. We apologize for any inconvenience.   Your physician has requested that you have cardiac CT. Cardiac computed tomography (CT) is a painless test that uses an x-ray machine to take clear, detailed pictures of your heart. For further information please visit [email or web address] Please follow instruction sheet as given.                            ZIO XT- Long Term Monitor Instructions  Your physician has requested you wear a ZIO patch monitor for 7 days.  This is a single patch  monitor. [employer] supplies one patch monitor per enrollment. Additional stickers are not available. Please do not apply patch if you will be having a Nuclear Stress Test,  Echocardiogram, Cardiac CT, MRI, or Chest Xray during the period you would be wearing the  monitor. The patch cannot be worn during these tests. You cannot remove and re-apply the  ZIO XT patch monitor.  Your ZIO patch monitor will be mailed 3 day USPS to your address on file. It may take 3-5 days  to receive your monitor after you have been enrolled.  Once you have received your monitor, please review the enclosed instructions. Your monitor  has already been registered assigning a specific monitor serial # to you.  Billing and Patient Assistance Program Information  We have supplied [employer] with any of your insurance information on file for billing purposes. [employer] offers a sliding scale Patient Assistance Program for patients that do not have  insurance, or whose insurance does not completely cover the cost of the ZIO monitor.  You must apply for the Patient Assistance Program to qualify for this discounted rate.  To apply, please call [employer] at [phone], select option 4, select option 2, ask to apply for  Patient Assistance Program. Meredeth will ask your household income, and how many people  are in your household. They will quote your out-of-pocket cost based on that information.  [employer] will also be able to set up a 76-month, interest-free payment plan if needed.  Applying the monitor   Shave hair from upper left  chest.  Hold abrader disc by orange tab. Rub abrader in 40 strokes over the upper left chest as  indicated in your monitor instructions.  Clean area with 4 enclosed alcohol pads. Let dry.  Apply patch as indicated in monitor instructions. Patch will be placed under collarbone on left  side of chest with arrow pointing upward.  Rub patch adhesive wings for 2 minutes. Remove white label marked 1.  Remove the white  label marked 2. Rub patch adhesive wings for 2 additional minutes.  While looking in a mirror, press and release button in center of patch. A small green light will  flash 3-4 times. This will be your only indicator that the monitor has been turned on.  Do not shower for the first 24 hours. You may shower after the first 24 hours.  Press the button if you feel a symptom. You will hear a small click. Record Date, Time and  Symptom in the Patient Logbook.  When you are ready to remove the patch, follow instructions on the last 2 pages of Patient  Logbook. Stick patch monitor onto the last page of Patient Logbook.  Place Patient Logbook in the blue and white box. Use locking tab on box and tape box closed  securely. The blue and white box has prepaid postage on it. Please place it in the mailbox as  soon as possible. Your physician should have your test results approximately 7 days after the  monitor has been mailed back to [facility].  Call [facility] Customer Care at [phone] if you have questions regarding  your ZIO XT patch monitor. Call them immediately if you see an orange light blinking on your  monitor.  If your monitor falls off in less than 4 days, contact our Monitor department at [phone].  If your monitor becomes loose or falls off after 4 days call [employer] at [phone] for  suggestions on securing your monitor    Follow-Up: At [facility], you and your health needs are our priority.  As part of our continuing mission to provide you with exceptional heart care, our providers are all part of one team.  This team includes your primary Cardiologist (physician) and Advanced Practice Providers or APPs (Physician Assistants and Nurse Practitioners) who all work together to provide you with the care you need, when you need it.  Your next appointment:   3 months   Provider:   Dr. Azobou  Thank you for choosing [facility]!!

## 2023-12-08 NOTE — Addendum Note (Signed)
 Addended by: GRETEL MAEOLA CROME on: 12/08/2023 04:05 PM   Modules accepted: Orders

## 2023-12-08 NOTE — Telephone Encounter (Signed)
 Patient returned RN's call.

## 2023-12-08 NOTE — Addendum Note (Signed)
 Addended by: GRETEL MAEOLA CROME on: 12/08/2023 03:51 PM   Modules accepted: Orders

## 2023-12-12 ENCOUNTER — Encounter: Payer: Self-pay | Admitting: Family Medicine

## 2023-12-13 ENCOUNTER — Emergency Department (HOSPITAL_COMMUNITY)
Admission: EM | Admit: 2023-12-13 | Discharge: 2023-12-14 | Disposition: A | Discharge status: Home / Self Care | Attending: Emergency Medicine | Admitting: Emergency Medicine

## 2023-12-13 ENCOUNTER — Emergency Department (HOSPITAL_COMMUNITY)

## 2023-12-13 ENCOUNTER — Other Ambulatory Visit: Payer: Self-pay

## 2023-12-13 ENCOUNTER — Encounter (HOSPITAL_COMMUNITY): Payer: Self-pay

## 2023-12-13 DIAGNOSIS — R42 Dizziness and giddiness: Secondary | ICD-10-CM | POA: Insufficient documentation

## 2023-12-13 DIAGNOSIS — I10 Essential (primary) hypertension: Secondary | ICD-10-CM | POA: Insufficient documentation

## 2023-12-13 DIAGNOSIS — Z79899 Other long term (current) drug therapy: Secondary | ICD-10-CM | POA: Insufficient documentation

## 2023-12-13 LAB — CBC
HCT: 49.3 % (ref 39.0–52.0)
Hemoglobin: 15.7 g/dL (ref 13.0–17.0)
MCH: 28.1 pg (ref 26.0–34.0)
MCHC: 31.8 g/dL (ref 30.0–36.0)
MCV: 88.4 fL (ref 80.0–100.0)
Platelets: 360 K/uL (ref 150–400)
RBC: 5.58 MIL/uL (ref 4.22–5.81)
RDW: 13.8 % (ref 11.5–15.5)
WBC: 7.1 K/uL (ref 4.0–10.5)
nRBC: 0 % (ref 0.0–0.2)

## 2023-12-13 LAB — BASIC METABOLIC PANEL WITH GFR
Anion gap: 14 (ref 5–15)
BUN: 19 mg/dL (ref 6–20)
CO2: 23 mmol/L (ref 22–32)
Calcium: 9.7 mg/dL (ref 8.9–10.3)
Chloride: 104 mmol/L (ref 98–111)
Creatinine, Ser: 1.05 mg/dL (ref 0.61–1.24)
GFR, Estimated: >60 mL/min (ref >60)
Glucose, Bld: 89 mg/dL (ref 70–99)
Potassium: 4.1 mmol/L (ref 3.5–5.1)
Sodium: 141 mmol/L (ref 135–145)

## 2023-12-13 LAB — TROPONIN T, HIGH SENSITIVITY: Troponin T High Sensitivity: <15 ng/L (ref 0–19)

## 2023-12-13 NOTE — Discharge Instructions (Signed)
 The tests in the ED did not show any signs of anemia, dehydration, heart rhythm problems or abnormalities in your brain CT scan.  Follow-up with your primary care doctor as we discussed for further evaluation of the episodes you been experiencing

## 2023-12-13 NOTE — ED Triage Notes (Signed)
 Pt reports feeling dizzy every time he gets in a car or in the shower. Pt states that his heart rate goes up. Last episode was Saturday on his way back from [place].

## 2023-12-13 NOTE — ED Provider Notes (Signed)
 [place] EMERGENCY DEPARTMENT AT [facility] Provider Note   CSN: [id number] Arrival date & time: 12/13/23  8161     Patient presents with: Tachycardia and Dizziness   Daryl Pace is a 48 y.o. male.    Dizziness    Patient has a history of kidney stones hypertension.  Patient presents ER for recurrent episodes of feeling dizzy lightheaded head pressure.  Patient states initially started when he was getting ready to drive in his car.  He suddenly had an episode of feeling lightheaded.  Patient states his head did not feel right and he felt dizzy.  He felt like his heart was racing.  I did not have a room spinning sensation.  He did not have any trouble with his speech or vision.  Patient states he has been feeling intermittently like his balance is off.  Over the last week he has been having recurrent episodes when he tries to drive in the car.  He has also had episodes when he is in the shower.  Patient states he feels somewhat anxious when these happen.  Right now he feels fine.  Prior to Admission medications   Medication Sig Start Date End Date Taking? Authorizing Provider  metoprolol  tartrate (LOPRESSOR ) 100 MG tablet Take 1 tablet two hours prior to CT testing 12/08/23   Azobou Tonleu, Joelle DEL, MD  rosuvastatin  (CRESTOR ) 10 MG tablet Take 1 tablet (10 mg total) by mouth daily. 12/08/23   Azobou Donley Joelle DEL, MD  sucralfate  (CARAFATE ) 1 g tablet Take 1 tablet (1 g total) by mouth 4 (four) times daily -  with meals and at bedtime. 07/03/23   Prosperi, Christian H, PA-C    Allergies: Patient has no known allergies.    Review of Systems  Neurological:  Positive for dizziness.    Updated Vital Signs BP 129/87   Pulse 62   Temp 98.5 F (36.9 C) (Oral)   Resp 18   SpO2 98%   Physical Exam Vitals and nursing note reviewed.  Constitutional:      General: He is not in acute distress.    Appearance: He is well-developed.  HENT:     Head: Normocephalic  and atraumatic.     Right Ear: External ear normal.     Left Ear: External ear normal.  Eyes:     General: No visual field deficit or scleral icterus.       Right eye: No discharge.        Left eye: No discharge.     Conjunctiva/sclera: Conjunctivae normal.  Neck:     Trachea: No tracheal deviation.  Cardiovascular:     Rate and Rhythm: Normal rate and regular rhythm.  Pulmonary:     Effort: Pulmonary effort is normal. No respiratory distress.     Breath sounds: Normal breath sounds. No stridor. No wheezing or rales.  Abdominal:     General: Bowel sounds are normal. There is no distension.     Palpations: Abdomen is soft.     Tenderness: There is no abdominal tenderness. There is no guarding or rebound.  Musculoskeletal:        General: No tenderness or deformity.     Cervical back: Neck supple.  Skin:    General: Skin is warm and dry.     Findings: No rash.  Neurological:     General: No focal deficit present.     Mental Status: He is alert and oriented to person, place, and time.  Cranial Nerves: No cranial nerve deficit, dysarthria or facial asymmetry.     Sensory: No sensory deficit.     Motor: No abnormal muscle tone, seizure activity or pronator drift.     Coordination: Coordination normal.     Comments:  able to hold both legs off bed for 5 seconds, sensation intact in all extremities,  no left or right sided neglect, normal finger-nose exam bilaterally, no nystagmus noted   Psychiatric:        Mood and Affect: Mood normal.     (all labs ordered are listed, but only abnormal results are displayed) Labs Reviewed  BASIC METABOLIC PANEL WITH GFR  CBC  TROPONIN T, HIGH SENSITIVITY    EKG: EKG Interpretation Date/Time:  Tuesday December 13 2023 18:49:14 EDT Ventricular Rate:  98 PR Interval:  131 QRS Duration:  81 QT Interval:  319 QTC Calculation: 408 R Axis:   69  Text Interpretation: Sinus rhythm Probable left atrial enlargement Since last tracing rate  slower Confirmed by Randol Simmonds [phone]) on 12/13/2023 6:57:55 PM  Radiology: CT Head Wo Contrast Result Date: 12/13/2023 EXAM: CT HEAD WITHOUT CONTRAST 12/13/2023 11:00:00 PM TECHNIQUE: CT of the head was performed without the administration of intravenous contrast. Automated exposure control, iterative reconstruction, and/or weight based adjustment of the mA/kV was utilized to reduce the radiation dose to as low as reasonably achievable. COMPARISON: None available. CLINICAL HISTORY: Head pressure, weakness. Notes from triage: Pt reports feeling dizzy every time he gets in a car or in the shower. Pt states that his heart rate goes up. Last episode was Saturday on his way back from [place]. FINDINGS: BRAIN AND VENTRICLES: No acute hemorrhage. No evidence of acute infarct. No hydrocephalus. No extra-axial collection. No mass effect or midline shift. ORBITS: No acute abnormality. SINUSES: No acute abnormality. SOFT TISSUES AND SKULL: No acute soft tissue abnormality. No skull fracture. IMPRESSION: 1. No acute intracranial abnormality. Electronically signed by: Dorethia Molt MD 12/13/2023 11:07 PM EDT RP Workstation: [id number]   DG Chest 2 View Result Date: 12/13/2023 CLINICAL DATA:  Chest pain and dizziness.  Elevated heart rate. EXAM: CHEST - 2 VIEW COMPARISON:  07/03/2023 FINDINGS: Normal heart size and pulmonary vascularity. No focal airspace disease or consolidation in the lungs. No blunting of costophrenic angles. No pneumothorax. Mediastinal contours appear intact. IMPRESSION: No active cardiopulmonary disease. Electronically Signed   By: Elsie Gravely M.D.   On: 12/13/2023 19:15     Procedures   Medications Ordered in the ED - No data to display  Clinical Course as of 12/13/23 2345  Tue Dec 13, 2023  2221 CBC Normal [JK]  2221 Basic metabolic panel Normal [JK]  2221 Chest x-ray without acute findings [JK]  2325 CT without acute abnormality [JK]    Clinical Course User Index [JK]  Randol Simmonds, MD                                 Medical Decision Making Differential diagnosis includes but not limited to anemia, arrhythmia electrolyte abnormality cerebral hemorrhage  Problems Addressed: Dizziness: acute illness or injury that poses a threat to life or bodily functions  Amount and/or Complexity of Data Reviewed Labs: ordered. Decision-making details documented in ED Course. Radiology: ordered.   Patient presented to the ED for evaluation of recurrent dizzy episodes.  Patient denies having vertigo type symptoms.  No room spinning.  She states he gets lightheaded and has an sensation  his heart is racing and has pressure in the back of his head.  No clear trigger for the symptoms as patient states he has experienced it while driving, while getting a haircut as well as while showering.  No neurodeficits noted on exam.  His labs are unremarkable with normal cardiac enzymes.  Normal electrolytes.  No anemia.  CT does not show any acute abnormality.  Chest x-ray is otherwise unremarkable.  No recurrent episodes in the ED.  Unclear etiology   It is possible he may be having some type of intermittent arrhythmia.  Panic disorder is also consideration although would not attribute his symptoms to that at this time.  Discussed findings with patient and his spouse.  He will follow-up with his primary care doctor to discuss further evaluation and possible outpatient cardiac monitoring.       Final diagnoses:  Dizziness    ED Discharge Orders     None          Randol Simmonds, MD 12/13/23 2345

## 2023-12-14 ENCOUNTER — Ambulatory Visit: Payer: Self-pay

## 2023-12-14 NOTE — Telephone Encounter (Signed)
 Called pt back to follow up on if he can get time off work this week to be seen by a HCP at [facility] for ongoing symptoms. Pt is awaiting call back from boss to confirm if he can get time off this Friday to schedule a sooner appointment. Pt will call back when he hears back. Advised to go to [facility] or ED for worsening symptoms, and to complete DPR form at next office appointment to be able to release information to pts wife.

## 2023-12-14 NOTE — Telephone Encounter (Signed)
 FYI Only or Action Required?: Action required by provider: clinical question for provider and spoke with both wife and husband individually. Concerns are: 1: Follow-up on recent ED visit for ongoing dizziness, head pressure, transient mild htn and tachycardia. 2: Question about new rx for crestor  by cardiologist. 3: Response to recent MyChart message from pt. 4: Pt would like to be seen sooner than 10/2 appt with Dr. Katrinka, needs to check with boss about taking time off work to get in sooner, concerned about job loss.  Patient was last seen in primary care on 07/12/2023 by Katrinka Garnette KIDD, MD.  Called Nurse Triage reporting Advice Only.  Symptoms began several days ago.  Interventions attempted: Rest, hydration, or home remedies.  Symptoms are: stable.  Triage Disposition: Information or Advice Only Call  Patient/caregiver understands and will follow disposition?: Declines ti schedule appt sooner than 10/2 due to work scheduling conflict. Pt will call back after he speaks with his boss so see about getting scheduled sooner.   Reason for Disposition  [1] Caller is not with the adult (patient) AND [2] probable NON-URGENT symptoms  [1] MODERATE dizziness (e.g., interferes with normal activities) AND [2] has been evaluated by doctor (or NP/PA) for this  Answer Assessment - Initial Assessment Questions Wife called asking to see if pt can be scheduled for sooner appt with pt after recent ED visit for symptoms ongoing since 9/20. Asking for clarification from PCP on recent new rx for crestor  by cardiologist. Also waiting on response from PCP to my chart message by regarding above symptoms and recent ED/cardiologist visit.  1. REASON FOR CALL: What is the main reason for your call? or How can I best help you?     Pt with ongoing dizziness, pressure in back of head, transient elevated blood pressure up to 150/90 since Saturday. BP 130/85 Today. Went to ED yesterday, told to follow up with PCP  and cardiology outpt. Schedule for PCP 10/21, asking to be scheduled with PCP sooner. 2. SYMPTOMS : Do you have any symptoms?      See above. Milder version of above symptoms in the past with car rides and when getting hair cut. 3. OTHER QUESTIONS: Do you have any other questions?     Reschedule PCP appt (current 01/10/24) for sooner date.  Answer Assessment - Initial Assessment Questions Spoke with wife earlier about pts symptoms and recent discharge from ED. Wife not listed as DPR. No information about pt given to wife, but was able to take down pts symptoms. Moved appt with PCP up from 10/21 to 10/2 to follow up on ED visit, also has questions about new rx from crestor  from cardiologist and is awaiting an unanswered mychart message to PCP. Called pt to follow-up on symptoms of ongoing episodes of pressure in back of head, dizziness, and mildly elevated heart rate and blood pressure, now worsening. Advised being seen sooner by a different provider. Pt agreeable, checking with boss to see if he can take time off work to be seen. Awaiting call back.  1. DESCRIPTION: Describe your dizziness.     General dizziness with long walks, car rides, showers and when getting haircut. Pressure at base of head and down neck, not a headache.  2. LIGHTHEADED: Do you feel lightheaded? (e.g., somewhat faint, woozy, weak upon standing)     Moderate, during long walks and when looking down while walking, feels like passing out. Passes with rest. 3. VERTIGO: Do you feel like either you or the room  is spinning or tilting? (i.e., vertigo)     No 4. SEVERITY: How bad is it?  Do you feel like you are going to faint? Can you stand and walk?     Moderate 5. ONSET:  When did the dizziness begin?     Saturday 9/20, hx of symptoms in the past when above situations occur over this past year. This time more severe. 6. AGGRAVATING FACTORS: Does anything make it worse? (e.g., standing, change in head  position)     Looking at the ground while walking, long drives or walk, showers, getting haircut. 7. HEART RATE: Can you tell me your heart rate? How many beats in 15 seconds?  (Note: Not all patients can do this.)       100-110 during episodes, recovers to 70s after episode 8. CAUSE: What do you think is causing the dizziness? (e.g., decreased fluids or food, diarrhea, emotional distress, heat exposure, new medicine, sudden standing, vomiting; unknown)     Unsure 9. RECURRENT SYMPTOM: Have you had dizziness before? If Yes, ask: When was the last time? What happened that time?     Yes, ongoing past year. Last episode on Saturday, most severe episode. 10. OTHER SYMPTOMS: Do you have any other symptoms? (e.g., fever, chest pain, vomiting, diarrhea, bleeding)       Worsening vision/nearsightedness, gradual over past few years, now more noticeable.  Protocols used: Information Only Call - No Triage-A-AH, Dizziness - Lightheadedness-A-AH

## 2023-12-14 NOTE — Telephone Encounter (Signed)
 Patient calling back from earlier triage to schedule hospital follow up. He is requesting to schedule on Friday 12/16/23 as he states previous nurse advised appointment are available, he needed to check with boss before scheduling. This Clinical research associate does not find any hospital follow up appointments available on 12/16/23 with pcp or any provider at practice location, offered next available hospital follow up on 12/19/23 but patient declines this offer due to scheduled CT scan. Patient stated he will need to make other arrangements if not able to come in on Friday. Requesting hospital follow up work in on Friday 12/16/23 with any provider, requests time around 1230-1 if possible.

## 2023-12-15 ENCOUNTER — Encounter (HOSPITAL_COMMUNITY): Payer: Self-pay

## 2023-12-16 ENCOUNTER — Encounter: Payer: Self-pay | Admitting: Physician Assistant

## 2023-12-16 ENCOUNTER — Other Ambulatory Visit (HOSPITAL_BASED_OUTPATIENT_CLINIC_OR_DEPARTMENT_OTHER): Payer: Self-pay

## 2023-12-16 ENCOUNTER — Ambulatory Visit: Admitting: Physician Assistant

## 2023-12-16 VITALS — Temp 98.2°F | Ht 72.0 in | Wt 178.2 lb

## 2023-12-16 DIAGNOSIS — I10 Essential (primary) hypertension: Secondary | ICD-10-CM | POA: Diagnosis not present

## 2023-12-16 DIAGNOSIS — E785 Hyperlipidemia, unspecified: Secondary | ICD-10-CM

## 2023-12-16 DIAGNOSIS — R42 Dizziness and giddiness: Secondary | ICD-10-CM

## 2023-12-16 DIAGNOSIS — M542 Cervicalgia: Secondary | ICD-10-CM | POA: Diagnosis not present

## 2023-12-16 MED ORDER — BLOOD PRESSURE MONITOR MISC
0 refills | Status: DC
  Filled 2023-12-16: qty 1, 30d supply, fill #0

## 2023-12-16 NOTE — Patient Instructions (Signed)
 It was great to see you!  Your blood pressure is elevated in our office today.  I recommend that you monitor this at home.  Your goal blood pressure should be around < 130/80, unless you are over 48 years old, your goal may be closer to 140-150/90. Please note if you have been given other goals from a cardiologist or other healthcare provider, please defer to their recommendations.  When preparing to take your blood pressure: Plan ahead. Don't smoke, drink caffeine or exercise within 30 minutes before taking your blood pressure. Empty your bladder. Don't take the measurement over clothes. Remove the clothing over the arm that will be used to measure blood pressure. You can use either arm unless otherwise told by a healthcare provider. Usually there is not a big difference between readings on them. Be still. Allow at least five minutes of quiet rest before measurements. Don't talk or use the phone. Sit correctly. Sit with your back straight and supported (on a dining chair, rather than a sofa). Your feet should be flat on the floor. Do not cross your legs. Support your arm on a flat surface. The middle of the cuff should be placed on the upper arm at heart level.  Measure at the same time of the day. Take multiple readings and record the results. Each time you measure, take two readings one minute apart. Record the results and bring in to your next office visit.  In order to know how well the medication is working, I would like you to take your readings 1-2 hours after taking your blood pressure medication if possible. Take your blood pressure measurements and record 2-3 days per week.  If you get a high blood pressure reading: A single high reading is not an immediate cause for alarm. If you get a reading that is higher than normal, take your blood pressure a second time. Write down the results of both measurements. Check with your health care professional to see if there's a health concern or  whether there may be problems with your monitor. If your blood pressure readings are suddenly higher than 180/120 mm Hg, wait at least one minute and test again. If your readings are still very high, contact your health care professional immediately. You could be having a hypertensive crisis. Call 911 if your blood pressure is higher than 180/120 mm Hg and if you are having new signs or symptoms that may include: Chest pain Shortness of breath Back pain Numbness Weakness Change in vision Difficulty speaking Confusion Dizziness Vomiting  Take care,  Jarold Motto PA-C

## 2023-12-16 NOTE — Progress Notes (Addendum)
 Daryl Pace is a 48 y.o. male here for a follow up of a pre-existing problem.  History of Present Illness:   Chief Complaint  Patient presents with   Follow-up    Pt was seen in the ED 9/23 for dizziness & head pressure, symptoms are better. He is c/o some neck. Blood pressure is coming down 131/88 yesterday, Also some blurred vision.    Discussed the use of AI scribe software for clinical note transcription with the patient, who gave verbal consent to proceed.  History of Present Illness   Daryl Pace is a 48 year old male who presents with dizziness and head pressure.  Dizziness and head pressure have persisted for about a week and a half, starting during a drive home from [place]. The symptoms are severe, requiring him to stop during car rides and are worsened by activities like showering and driving. He is unable to drive or be in a car without experiencing these symptoms, affecting his daily activities and work.  He experiences these episodes annually, though the current episode is more severe. He describes a sensation similar to muscle fatigue in his head and reports a 'wild pop' in his neck that provided some relief. He also experiences a sensation of a film over his eyes and occasional blurry vision.  Previous workup including blood work, CT scan of head, chest x-ray, and EKG were normal. He has high blood pressure and is not taking his prescribed cholesterol medication -- he prefers to review this with Primary Care Provider (PCP) before starting.  He has experienced chest discomfort and a racing heart in the past, with a heart monitor planned for further evaluation. He wore a heart monitor in the past however it was never received after he mailed it back in so there were never any reported results.  He works in a physical and repetitive job at [employer], with long hours and exposure to bright lights and noise. He has difficulty maintaining hydration at work due  to limited access to water.   He is scheduled for a CT CORONARY MORPH W/CTA COR W/SCORE W/CA W/CM &/OR WO/CM  with his cardiologist in a few days.       Past Medical History:  Diagnosis Date   Chicken pox    Hypertension    diagnosed at work screenings   Kidney stone    around age 27, peed them out     Social History   Tobacco Use   Smoking status: Every Day    Current packs/day: 0.60    Average packs/day: 0.6 packs/day for 13.0 years (7.8 ttl pk-yrs)    Types: Cigarettes   Smokeless tobacco: Never  Vaping Use   Vaping status: Never Used  Substance Use Topics   Alcohol use: Yes    Alcohol/week: 5.0 - 10.0 standard drinks of alcohol    Types: 5 - 10 Standard drinks or equivalent per week   Drug use: No    Past Surgical History:  Procedure Laterality Date   HERNIA REPAIR     around 25, bilateral    Family History  Problem Relation Age of Onset   Stroke Mother        early 38s, drop foot   Hypertension Mother    Diabetes Mother    Hypertension Brother    Breast cancer Maternal Aunt     No Known Allergies  Current Medications:   Current Outpatient Medications:    Blood Pressure Monitor MISC, Use to  check blood pressure as directed, Disp: 1 each, Rfl: 0   Simethicone (GAS-X PO), Take 1 tablet by mouth as needed., Disp: , Rfl:    rosuvastatin  (CRESTOR ) 10 MG tablet, Take 1 tablet (10 mg total) by mouth daily. (Patient not taking: Reported on 12/16/2023), Disp: 30 tablet, Rfl: 3   Review of Systems:   Negative unless otherwise specified per HPI.  Vitals:   Vitals:   12/16/23 1123 12/16/23 1212 12/16/23 1214  Temp: 98.2 F (36.8 C)    TempSrc: Temporal    SpO2: 96% 97% 97%  Weight: 178 lb 4 oz (80.9 kg)    Height: 6' (1.829 m)       Body mass index is 24.18 kg/m.  Orthostatic VS for the past 72 hrs (Last 3 readings):  Orthostatic BP Patient Position BP Location Cuff Size Orthostatic Pulse  12/16/23 1214 128/80 Standing Left Arm Large 91   12/16/23 1212 136/80 Sitting Left Arm Large 67  12/16/23 1123 136/80 Supine Left Arm Large 64    Physical Exam:   Physical Exam Vitals and nursing note reviewed.  Constitutional:      General: He is not in acute distress.    Appearance: He is well-developed. He is not ill-appearing or toxic-appearing.  Cardiovascular:     Rate and Rhythm: Normal rate and regular rhythm.     Pulses: Normal pulses.     Heart sounds: Normal heart sounds, S1 normal and S2 normal.  Pulmonary:     Effort: Pulmonary effort is normal.     Breath sounds: Normal breath sounds.  Musculoskeletal:     Comments: No neck tenderness to palpation  Normal range of motion   Skin:    General: Skin is warm and dry.  Neurological:     General: No focal deficit present.     Mental Status: He is alert.     GCS: GCS eye subscore is 4. GCS verbal subscore is 5. GCS motor subscore is 6.     Cranial Nerves: Cranial nerves 2-12 are intact.     Sensory: Sensation is intact.     Motor: Motor function is intact.     Coordination: Coordination is intact.     Gait: Gait is intact.  Psychiatric:        Speech: Speech normal.        Behavior: Behavior normal. Behavior is cooperative.     Assessment and Plan:   Assessment and Plan    Episodic lightheadedness  Normal CT, chest x-ray, and EKG. CT angiogram planned to rule out vascular causes. - Proceed with scheduled cardiac CT and Zio patch to get more insight - Encourage hydration. - Perform orthostatic blood pressure measurements. Negative in office today. - Neuro exam wnl - recommend regular eye exam - Consider additional imaging if indicated -- brain MRI/carotid us  etc  Neck pain Possible musculoskeletal origin No red flags; improved  - consider sports medicine referral if returns/worsens  Primary Hypertension Blood pressure well-controlled through lifestyle modifications. Previous medication caused adverse effects. Current home monitor (watch) may be  inaccurate. - Send prescription for blood pressure monitor for insurance assessment. - Encourage continued lifestyle modifications. - Advise against frequent blood pressure checks.  Hyperlipidemia Not taking prescribed medication due to concerns. Previous calcium  score zero. Discussed dietary fiber supplementation. - Consider psyllium fiber supplementation. - Discuss cholesterol management with cardiologist and Primary Care Provider (PCP)       I personally spent a total of 52 minutes in the care of the  patient today including preparing to see the patient, getting/reviewing separately obtained history, performing a medically appropriate exam/evaluation, counseling and educating, placing orders, and documenting clinical information in the EHR.      Lucie Buttner, PA-C

## 2023-12-16 NOTE — Telephone Encounter (Signed)
 I'm out of office- appears he is seeing [place]. He has health issues for sure but has fair amount of anxiety as well just as FYI

## 2023-12-19 ENCOUNTER — Ambulatory Visit (HOSPITAL_BASED_OUTPATIENT_CLINIC_OR_DEPARTMENT_OTHER)
Admission: RE | Admit: 2023-12-19 | Discharge: 2023-12-19 | Disposition: A | Discharge status: Home / Self Care | Source: Ambulatory Visit | Attending: Cardiovascular Disease | Admitting: Cardiovascular Disease

## 2023-12-19 ENCOUNTER — Ambulatory Visit (HOSPITAL_COMMUNITY): Admission: RE | Admit: 2023-12-19 | Discharge: 2023-12-19 | Disposition: A | Source: Ambulatory Visit

## 2023-12-19 ENCOUNTER — Other Ambulatory Visit: Payer: Self-pay | Admitting: Cardiovascular Disease

## 2023-12-19 DIAGNOSIS — R0789 Other chest pain: Secondary | ICD-10-CM | POA: Diagnosis present

## 2023-12-19 DIAGNOSIS — R931 Abnormal findings on diagnostic imaging of heart and coronary circulation: Secondary | ICD-10-CM | POA: Insufficient documentation

## 2023-12-19 DIAGNOSIS — I2510 Atherosclerotic heart disease of native coronary artery without angina pectoris: Secondary | ICD-10-CM

## 2023-12-19 MED ORDER — NITROGLYCERIN 0.4 MG SL SUBL
0.8000 mg | SUBLINGUAL_TABLET | Freq: Once | SUBLINGUAL | Status: AC
  Administered 2023-12-19: 0.8 mg via SUBLINGUAL

## 2023-12-19 MED ORDER — IOHEXOL 350 MG/ML SOLN
100.0000 mL | Freq: Once | INTRAVENOUS | Status: AC | PRN
  Administered 2023-12-19: 100 mL via INTRAVENOUS

## 2023-12-22 ENCOUNTER — Ambulatory Visit: Admitting: Family Medicine

## 2023-12-23 ENCOUNTER — Encounter: Payer: Self-pay | Admitting: Family Medicine

## 2023-12-23 NOTE — Telephone Encounter (Signed)
 I have not taken care of [name] to my knowledge.  He was recently seen by Dr. Ren, please see EMR.   Daryl Pace [place], DO, FACC

## 2023-12-26 ENCOUNTER — Encounter: Payer: Self-pay | Admitting: Family Medicine

## 2023-12-26 ENCOUNTER — Other Ambulatory Visit (HOSPITAL_BASED_OUTPATIENT_CLINIC_OR_DEPARTMENT_OTHER): Payer: Self-pay

## 2023-12-27 ENCOUNTER — Other Ambulatory Visit (HOSPITAL_COMMUNITY): Payer: Self-pay

## 2023-12-27 ENCOUNTER — Ambulatory Visit: Payer: Self-pay

## 2023-12-27 DIAGNOSIS — E785 Hyperlipidemia, unspecified: Secondary | ICD-10-CM

## 2023-12-27 MED ORDER — ISOSORBIDE MONONITRATE ER 30 MG PO TB24
30.0000 mg | ORAL_TABLET | Freq: Every day | ORAL | 3 refills | Status: DC
Start: 2023-12-27 — End: 2024-03-26

## 2023-12-27 MED ORDER — ISOSORBIDE MONONITRATE ER 30 MG PO TB24
30.0000 mg | ORAL_TABLET | Freq: Every day | ORAL | 3 refills | Status: DC
  Filled 2023-12-27: qty 90, 90d supply, fill #0

## 2023-12-28 ENCOUNTER — Telehealth: Payer: Self-pay

## 2023-12-28 NOTE — Telephone Encounter (Signed)
 Received disability form from [employer].  Patient signed the Release of Information and paid the $29 fee.  Form in Azobou's box.

## 2024-01-10 ENCOUNTER — Encounter: Payer: Self-pay | Admitting: Family Medicine

## 2024-01-10 ENCOUNTER — Ambulatory Visit: Admitting: Family Medicine

## 2024-01-10 VITALS — BP 110/74 | HR 103 | Temp 98.5°F | Ht 72.0 in | Wt 175.2 lb

## 2024-01-10 DIAGNOSIS — F54 Psychological and behavioral factors associated with disorders or diseases classified elsewhere: Secondary | ICD-10-CM | POA: Diagnosis not present

## 2024-01-10 DIAGNOSIS — I10 Essential (primary) hypertension: Secondary | ICD-10-CM

## 2024-01-10 DIAGNOSIS — R002 Palpitations: Secondary | ICD-10-CM | POA: Diagnosis not present

## 2024-01-10 DIAGNOSIS — R42 Dizziness and giddiness: Secondary | ICD-10-CM

## 2024-01-10 DIAGNOSIS — R0789 Other chest pain: Secondary | ICD-10-CM

## 2024-01-10 DIAGNOSIS — F17200 Nicotine dependence, unspecified, uncomplicated: Secondary | ICD-10-CM

## 2024-01-10 MED ORDER — HYDROXYZINE HCL 25 MG PO TABS: 12.5000 mg | ORAL_TABLET | Freq: Three times a day (TID) | ORAL | 2 refills | Status: DC | PRN

## 2024-01-10 NOTE — Progress Notes (Signed)
 Phone [phone] In person visit   Subjective:   Daryl Pace is a 48 y.o. year old very pleasant male patient who presents for/with See problem oriented charting Chief Complaint  Patient presents with   Hypertension    Pt declines all vaccines, patient is fasting.   Weight Loss   Dizziness    Past Medical History-  Patient Active Problem List   Diagnosis Date Noted   Current smoker 11/20/2014    Priority: High   Palpitations 03/31/2022    Priority: Medium    Eczema 05/22/2018    Priority: Medium    GAD (generalized anxiety disorder) 10/01/2017    Priority: Medium    Atypical chest pain 07/10/2015    Priority: Medium    Hyperlipidemia 03/06/2015    Priority: Medium    Hypertension     Priority: Medium    Plantar fibromatosis 11/22/2018    Priority: Low    Medications- reviewed and updated Current Outpatient Medications  Medication Sig Dispense Refill   hydrOXYzine  (ATARAX ) 25 MG tablet Take 0.5-1 tablets (12.5-25 mg total) by mouth every 8 (eight) hours as needed for anxiety. 60 tablet 2   rosuvastatin  (CRESTOR ) 10 MG tablet Take 1 tablet (10 mg total) by mouth daily. 30 tablet 3   Simethicone (GAS-X PO) Take 1 tablet by mouth as needed.     No current facility-administered medications for this visit.     Objective:  BP 110/74 (BP Location: Left Arm, Patient Position: Sitting, Cuff Size: Normal)   Pulse (!) 103   Temp 98.5 F (36.9 C) (Temporal)   Ht 6' (1.829 m)   Wt 175 lb 3.2 oz (79.5 kg)   SpO2 95%   BMI 23.76 kg/m  Gen: Anxious appearing CV: RRR no murmurs rubs or gallops.  Heart rate at 92 on my exam Lungs: CTAB no crackles, wheeze, rhonchi Abdomen: soft/nontender/nondistended/normal bowel sounds. No rebound or guarding.  Ext: no edema Skin: warm, dry     Assessment and Plan    # Social update-out of work and I will need to help him with some paperwork that he is going to submit-unclear when he is going to be able to return to work  but I think he is going to need his neurology and cardiology visits first  # Elevated PSA-follows with [facility] for low risk prostate cancer under active surveillance  # Atypical chest pain/palpitations S: had been seen emergency department  07/03/23 after 5 to 6 days of nonexertional chest pain radiating to his back associated with belching.  CBC and BMP unremarkable.  Troponin not elevated.  Chest x-ray with no acute abnormality but EKG with nonspecific ST elevations to be early repolarization.  Was started on Protonix  and Carafate  for potential reflux.  He never started the Carafate . 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 He is extremely concerned that his issues could be related to his heart.  He has also been having palpitations on pretty regular basis and has worn a heart monitor recently.  A lot of times the palpitations go along with dizziness -has turned in cardiac monitor.  A/P: 48 year old male  with history of atypical chest pain with potential chronic total occlusion of RCA but has not tolerated Imdur due to headaches who also has ongoing palpitations associated with dizziness (see dizziness discussion below) - He has an echocardiogram pending as well as cardiac monitor which he has turned in - I want him to have close follow-up with cardiology right after this with patient's chief concern on being out of work as if he could have a cardiac or neurologic recommendation (see neurological concern below) and therefore is not working - I told him I was very concerned this could be anxiety  related with his history of anxiety but that we also need to rule out other causes before attributing firmly   # Dizziness S: On prior notes-patient reports motion/dizziness sensation/disequilibrium when walking, sitting, getting up and especially when driving or riding in a car (though today he primarily reports this as dizziness and palpitations).  Gets heart racing with these episodes.  Reports constant buzzing sensation along with discomfort in the back of his head.  Reports of walking and looks down at lines on the sidewalk he becomes dizzy.  Mid-September had missed 3 days of work related to this. -He ended up with an emergency department visit on 12/13/2023 with reassuring BMP, troponin trend not elevated, chest x-ray reassuring and CT of the head with no acute intracranial abnormalities.  Had reassuring neurological exam. - then on 12/16/23-He was seen by Lucie Buttner, PA reported that symptoms were most severe with driving-had started during drive home from [place].  Has had a sensation of a film over his eyes with occasional blurry vision with episodes.  He works a physical and repetitive job at [employer] with long hours and exposure to bright lights and noise and reports triggering there as well and  Has limited access to water.  Also has plan to Zio patch through cardiology as often accompanied by palpitaionts. At that visit- they  Encouraged increased hydration.  Negative orthostatic vital signs -he reports weight loss with stress of medical issues- down 11 lbs in 6 months  Today he reports ongoing extreme difficulties due to the dizziness and palpitation episodes.  He is so anxious about driving with the symptoms that he is essentially leaning heavily on family and friends to drive.  Reports extreme stress with haircuts-palpitations and even some chest pain and similar sensation in the shower.  Prior neck tension seem to get better when he felt a pop in his neck but has some  recurrent tightness as well. He reports with motion particularly  bothered such as dip in car even if not driving, haircut, shower- those 3 things trigger him into palpitations and dizziness.  Does not feel motion or like room is spinning but motion itself triggers hip.  A/P: Patient with chronic dizziness and reassuring recent head CT.   -Offered MRI of the brain and he is going to attempt this but not sure if he can tolerate it-hopefully may be able to tolerate with the help of hydroxyzine  beforehand -We opted to get carotid ultrasound as well  -with extent this is bothering him without it being clearly vertigo also refer to neurologyf or their opinion  # Anxiety-I think this could be playing a large role in his symptoms and he has used hydroxyzine  in the past-I encouraged him to retrial this -I think he could benefit from SSRI but he previously felt emotionally restricted so I am hesitant on that or other SSRIs - Consider psychiatry referral -  Trial of hydroxyzine  as above - Plan no TSH but we opted to check this at next visit when he comes back in a month -She is willing to see a psychologist to see if they can help him deal with all the stressors-he does not think his issues are primarily anxiety but he does recognize he has some anxiety from the conditions themselves   #smoking- 1 pack 3 days.  I strongly encouraged cessation  #hypertension S: medication: None/diet controlled - Had required lisinopril  in the past but had weight loss and blood pressure improved A/P: Has had some variations up to 140 at home but today very well-controlled-continue without medication   # Hyperglycemia/insulin resistance/prediabetes-peak A1c of 5.9 in 2024 S:  Medication: none  Lab Results  Component Value Date   HGBA1C 5.9 07/12/2023   HGBA1C 5.9 11/25/2022   HGBA1C 5.9 07/08/2022  A/P: Too soon to repeat A1c but likely recheck next visit  Recommended follow up: Return in about 1 month (around  02/10/2024) for followup or sooner if needed.Schedule b4 you leave. Future Appointments  Date Time Provider Department Center  01/19/2024  1:00 PM [facility] 2 [facility] H&V  02/02/2024  1:00 PM GI-315 MR 3 GI-315MRI [facility] [address] [facility]  02/08/2024  9:40 AM Hilty, Vinie BROCKS, MD [facility] [address]  02/27/2024 10:00 AM Azobou Donley Joelle DEL, MD [facility] H&V    Lab/Order associations:   ICD-10-CM   1. Atypical chest pain  R07.89     2. Palpitations  R00.2     3. Dizziness  R42 VAS US  CAROTID    Ambulatory referral to Neurology    MR BRAIN W WO CONTRAST    4. Stress-related physiological response affecting medical condition  F54 Ambulatory referral to Psychology    5. Current smoker  F17.200     6. Primary hypertension  I10       Meds ordered this encounter  Medications   hydrOXYzine  (ATARAX ) 25 MG tablet    Sig: Take 0.5-1 tablets (12.5-25 mg total) by mouth every 8 (eight) hours as needed for anxiety.    Dispense:  60 tablet    Refill:  2   I personally spent a total of 45 minutes in the care of the patient today including preparing to see the patient, getting/reviewing separately obtained history, performing a medically appropriate exam/evaluation, counseling and educating, placing orders, referring and communicating with other health care professionals, and documenting clinical information in the EHR.   Return precautions advised.  Garnette Lukes, MD

## 2024-01-10 NOTE — Patient Instructions (Addendum)
 Next steps: call and schedule cardiology follow up sooner if possible- I really do not think this is your heart but once we have echocardiogram, monitor results back I think sitting down makes sense especially since you are out of work related to this.  -cardiology should also call you about carotid ultrasound  We will call you within two weeks about your referral to [facility] through [facility].  Their phone number is [phone].  Please call them if you have not heard in 1-2 weeks  We have placed a referral for you today to neurology- please call their # if you do not hear within a week (may be listed below or you may see mychart message within a few days with #).   Trial hydroxyzine  as I think anxiety is playing a role interest he severity of symptoms.   Please call [phone] to schedule a visit with [facility] - please tell the office you were directly referred by Dr. Katrinka Finn sometimes can be backed up for up to 2 months so here are some other options to check in on: - [email or web address]  at  [phone] [facility] [email or web address] at [phone]  Recommended follow up: Return in about 1 month (around 02/10/2024) for followup or sooner if needed.Schedule b4 you leave.

## 2024-01-12 ENCOUNTER — Encounter: Payer: Self-pay | Admitting: Family Medicine

## 2024-01-12 ENCOUNTER — Telehealth: Payer: Self-pay | Admitting: Family Medicine

## 2024-01-12 ENCOUNTER — Telehealth: Payer: Self-pay

## 2024-01-12 DIAGNOSIS — I2510 Atherosclerotic heart disease of native coronary artery without angina pectoris: Secondary | ICD-10-CM | POA: Insufficient documentation

## 2024-01-12 NOTE — Telephone Encounter (Signed)
 Pt is calling to get his heart monitor results. Please advise

## 2024-01-12 NOTE — Telephone Encounter (Signed)
 Patient identification verified by 2 forms.   Pt advised monitor results are back but have not been reviewed yet.  Pt requested office visit be moved up due to being out of work and wanting to get back to work.  Appt was moved up.   Patient agrees with plan, no questions at this time

## 2024-01-12 NOTE — Telephone Encounter (Signed)
 Type of form received: Disability paperwork  Additional comments:   Received by: Etta  Form should be Faxed to: [phone]  Form should be mailed to:    Is patient requesting call for pickup:   Form placed:  Provider's box  Attach charge sheet. yes  Individual made aware of 3-5 business day turn around (Y/N)? Y

## 2024-01-13 NOTE — Telephone Encounter (Signed)
 Patient's paperwork and $29 check was mailed to patient this morning.

## 2024-01-13 NOTE — Telephone Encounter (Signed)
 Forms completed and faxed successfully to [facility]; original given to front office with charge sheet copy saved on CMA desk.

## 2024-01-17 ENCOUNTER — Ambulatory Visit: Payer: Self-pay | Admitting: Family Medicine

## 2024-01-17 ENCOUNTER — Ambulatory Visit (HOSPITAL_COMMUNITY)
Admission: RE | Admit: 2024-01-17 | Discharge: 2024-01-17 | Disposition: A | Discharge status: Home / Self Care | Source: Ambulatory Visit | Attending: Family Medicine | Admitting: Family Medicine

## 2024-01-17 DIAGNOSIS — R42 Dizziness and giddiness: Secondary | ICD-10-CM | POA: Diagnosis not present

## 2024-01-18 ENCOUNTER — Other Ambulatory Visit (HOSPITAL_COMMUNITY): Payer: Self-pay

## 2024-01-19 ENCOUNTER — Ambulatory Visit (HOSPITAL_COMMUNITY)
Admission: RE | Admit: 2024-01-19 | Discharge: 2024-01-19 | Disposition: A | Discharge status: Home / Self Care | Source: Ambulatory Visit | Attending: Internal Medicine | Admitting: Internal Medicine

## 2024-01-19 ENCOUNTER — Ambulatory Visit

## 2024-01-19 DIAGNOSIS — R0609 Other forms of dyspnea: Secondary | ICD-10-CM | POA: Diagnosis not present

## 2024-01-19 DIAGNOSIS — R072 Precordial pain: Secondary | ICD-10-CM

## 2024-01-19 DIAGNOSIS — R0789 Other chest pain: Secondary | ICD-10-CM | POA: Diagnosis present

## 2024-01-19 LAB — ECHOCARDIOGRAM COMPLETE
AR max vel: 2.53 cm2
AV Area VTI: 2.49 cm2
AV Area mean vel: 1.97 cm2
AV Mean grad: 3 mmHg
AV Peak grad: 5.8 mmHg
Ao pk vel: 1.2 m/s
Area-P 1/2: 3.5 cm2
S' Lateral: 2.7 cm

## 2024-01-20 ENCOUNTER — Encounter: Payer: Self-pay | Admitting: Family Medicine

## 2024-01-20 DIAGNOSIS — R000 Tachycardia, unspecified: Secondary | ICD-10-CM | POA: Diagnosis not present

## 2024-01-27 ENCOUNTER — Ambulatory Visit: Admitting: Family Medicine

## 2024-01-27 ENCOUNTER — Encounter: Payer: Self-pay | Admitting: Family Medicine

## 2024-01-27 VITALS — BP 118/68 | HR 92 | Temp 97.9°F | Ht 72.0 in | Wt 171.6 lb

## 2024-01-27 DIAGNOSIS — R0789 Other chest pain: Secondary | ICD-10-CM | POA: Diagnosis not present

## 2024-01-27 DIAGNOSIS — R002 Palpitations: Secondary | ICD-10-CM | POA: Diagnosis not present

## 2024-01-27 DIAGNOSIS — F411 Generalized anxiety disorder: Secondary | ICD-10-CM | POA: Diagnosis not present

## 2024-01-27 MED ORDER — SERTRALINE HCL 25 MG PO TABS: 25.0000 mg | ORAL_TABLET | Freq: Every day | ORAL | 3 refills | Status: DC

## 2024-01-27 MED ORDER — OMEPRAZOLE 40 MG PO CPDR: 40.0000 mg | DELAYED_RELEASE_CAPSULE | Freq: Every day | ORAL | 3 refills | Status: DC

## 2024-01-27 NOTE — Progress Notes (Signed)
 Phone [phone] In person visit   Subjective:   Daryl Pace is a 48 y.o. year old very pleasant male patient who presents for/with See problem oriented charting Chief Complaint  Patient presents with   Medical Management of Chronic Issues    1 month follow up;     Past Medical History-  Patient Active Problem List   Diagnosis Date Noted   Current smoker 11/20/2014    Priority: High   Palpitations 03/31/2022    Priority: Medium    Eczema 05/22/2018    Priority: Medium    GAD (generalized anxiety disorder) 10/01/2017    Priority: Medium    Atypical chest pain 07/10/2015    Priority: Medium    Hyperlipidemia 03/06/2015    Priority: Medium    Hypertension     Priority: Medium    Plantar fibromatosis 11/22/2018    Priority: Low   CAD (coronary artery disease) 01/12/2024    Medications- reviewed and updated Current Outpatient Medications  Medication Sig Dispense Refill   hydrOXYzine  (ATARAX ) 25 MG tablet Take 0.5-1 tablets (12.5-25 mg total) by mouth every 8 (eight) hours as needed for anxiety. 60 tablet 2   omeprazole (PRILOSEC) 40 MG capsule Take 1 capsule (40 mg total) by mouth daily. 30 capsule 3   rosuvastatin  (CRESTOR ) 10 MG tablet Take 1 tablet (10 mg total) by mouth daily. 30 tablet 3   sertraline (ZOLOFT) 25 MG tablet Take 1 tablet (25 mg total) by mouth daily. 30 tablet 3   Simethicone (GAS-X PO) Take 1 tablet by mouth as needed.     No current facility-administered medications for this visit.     Objective:  BP 118/68 (BP Location: Left Arm, Patient Position: Sitting, Cuff Size: Normal)   Pulse 92   Temp 97.9 F (36.6 C) (Temporal)   Ht 6' (1.829 m)   Wt 171 lb 9.6 oz (77.8 kg)   SpO2 96%   BMI 23.27 kg/m  Gen: NAD, resting comfortably CV: RRR no murmurs rubs or gallops Lungs: CTAB no crackles, wheeze, rhonchi Ext: no edema Skin: warm, dry    Assessment and Plan   # Atypical chest pain/palpitations/dizziness S: Patient with history of  atypical chest pain with potential chronic total occlusion of RCA not tolerating Imdur due to headaches with ongoing palpitations and associated dizziness - Since last visit echocardiogram back completely reassuring and event monitor without significant arrhythmias.  When he noted palpitations he was in a normal rhythm-no significant arrhythmias during nearly 10 days of wearing monitor -Carotid ultrasound with very low levels of plaque which would not cause his current symptoms of dizziness -He has follow-up with cardiology in 3 days  He is scheduled for MRI of the brain on 02/09/2024 with and without contrast with ongoing dizziness.  Also has MRI of his prostate upcoming with Dr. Alvaro  We had referred him to neurology about 3 weeks ago-he has not had success scheduling that yet- he didn't realize he needed to call them-   -still getting palpitations and dizziness- more emotional triggers still- driving or close quarters  Patient also reached out about a week ago about gas pains and discomfort after eating-Gas-X or antiacid seem to be beneficial-had been prescribed Carafate  in the ER in April.  I mention trying Prilosec until her visit today- he started this and seeing some improvement- and easing off.  Left sided pain after eating is subsiding with omeprazole but still certain foods can trigger it like tomato, onion, tacos, pizza.  We have also been concerned anxiety could be playing a significant role with this and he has plans to see psychologist   He has been able to drive around town (some anxiety and not severe) but not get on highway yet. Struggle getting on highway for years- worsened in last several months. Has not been out of town in nearly 10 years he states.    A/P:  Atypical chest pain seems to have improved outside of mealtime which we think is GERD related-see below.  He had a reassuring echocardiogram and cardiac monitor in regards to this as well as palpitations and has upcoming  cardiology visit to discuss the chronic total occlusion of the RCA but his course and symptoms are pointing more towards anxiety and less from cardiac or neurological origin    GERD- I do think reflux is causing some of your symptom(s)- lets stop the OTC (available over the counter without a prescription) version and start omeprazole/Prilosec 40 mg daily for the next 1-2 months and if symptom(s) resolve can go back down to the 20 mg OTC (available over the counter without a prescription) version -some weight loss with reflux and trying to eat healthier  Palpitations and dizziness correlate closely but cardiac monitor did not show any significant arrhythmias even when he noted symptoms which was very reassuring.  Does still have upcoming MRI of the brain but I am less and less concerned about neurological origin as well.  He never scheduled with neurology but we opted to hold off at this point  Anxiety- he's in process of working on paperwork to see the therapist-encouraged him to complete this.  He has a generalized anxiety but ongoing fear of driving in close quarters-since this is severe enough to keep him away from work right now.  We are going to try sertraline 25 mg - Prior failures of Lexapro  with feelings of emotional restriction, ineffectiveness of buspirone  - Since the anxiety seems to be keeping him out of work we decided get more aggressive both with medication and referral to psychiatry as well as pushing him to follow through with psychology.  He does want to get back to work and is in agreement to push forward on these -Can use hydroxyzine  as needed as well  #Smoking- 8-10 per day.  Encouraged full cessation-she is not ready to quit yet   Recommended follow up: Return in about 3 weeks (around 02/17/2024) for followup or sooner if needed.Schedule b4 you leave. Future Appointments  Date Time Provider Department Center  01/30/2024 11:40 AM Azobou Donley Joelle DEL, MD [facility] H&V   02/02/2024  1:00 PM GI-315 MR 3 GI-315MRI GI-[address] [facility]  02/08/2024  9:40 AM Mona Vinie BROCKS, MD [facility] [address]  02/09/2024  2:40 PM GI-315 MR 1 GI-315MRI GI-[address]  02/28/2024  9:00 AM Katrinka Garnette KIDD, MD [facility]    Lab/Order associations:   ICD-10-CM   1. GAD (generalized anxiety disorder)  F41.1 Ambulatory referral to Psychiatry      Meds ordered this encounter  Medications   omeprazole (PRILOSEC) 40 MG capsule    Sig: Take 1 capsule (40 mg total) by mouth daily.    Dispense:  30 capsule    Refill:  3   sertraline (ZOLOFT) 25 MG tablet    Sig: Take 1 tablet (25 mg total) by mouth daily.    Dispense:  30 tablet    Refill:  3    I personally spent a total of 41  minutes in the care of the patient today including preparing to see the patient, getting/reviewing separately obtained history, performing a medically appropriate exam/evaluation, counseling and educating, placing orders, referring and communicating with other health care professionals, and documenting clinical information in the EHR. Since cardiac and neurologic causes of his symptoms seem less likely we had an extended discussion about getting more aggressive about anxiety treatment and workup to try to get him back to work as soon as possible-close 3-week follow-up planned-hopefully he can see psychology as well and this time  Return precautions advised.  Garnette Lukes, MD

## 2024-01-27 NOTE — Progress Notes (Signed)
       Cardiology Office Note Date:  01/30/2024  ID:  Daryl Pace, Daryl Pace 07/22/1975, MRN [id number] PCP:  Katrinka Garnette KIDD, MD  Cardiologist: Joelle VEAR Ren Donley, MD  No chief complaint on file.    Problems CP Coronary CTA 9/25- RCA CTO TTE 10/25- 55-60%, GIDD Stopped Imdur due to dizziness Dizziness: EM 10/25 unremarkable Carotid Doppler 10/25 with mild plaque HTN HLD on RN10 LDL 199, TG 219 4/25 HA1C 5.9 4/25 Tobacco use 7.8 pack year  Visits  9/25: CTCA, TTE, 7 day event monitor, RN10    History of Present Illness: Daryl Pace is a 48 y.o. male who presents for follow up.   Last visit, we ordered coronary CTA, 2D echo and event monitor.  The event monitor and carotid ultrasound were all unremarkable.  Coronary CT has showed a RCA CTO and 2D echo showed normal EF.  He reports feeling much better since last visit.  He has been able to drive around town, though still can drive on the highway.  He reports 1 episode of left-sided chest pain that was under his rib that resolved with starting Prilosec.  Otherwise, he denies any angina but does report minimal dyspnea with exertion.  He performs minimal physical activity, walking 10 to 15 minutes about twice a week.  He denies any side effect with the statin.  ROS: Otherwise negative  Physical Exam VS:  BP 120/72 (BP Location: Left Arm, Patient Position: Sitting, Cuff Size: Normal)   Pulse 93   Ht 6' (1.829 m)   Wt 176 lb (79.8 kg)   SpO2 97%   BMI 23.87 kg/m  , BMI Body mass index is 23.87 kg/m. GEN: Well nourished, well developed, in no acute distress HEENT: normal Neck: no JVD, carotid bruits, or masses Cardiac: RRR; no murmurs, rubs, or gallops,no edema  Respiratory:  CTAB bilaterally, normal work of breathing GI: soft, nontender, nondistended, + BS Extremities: No LE edema Skin: warm and dry, no rash Neuro:  Strength and sensation are intact  Recent Labs: Reviewed  ASSESSMENT AND PLAN Daryl Pace is a 48 y.o. male who presents for follow up.  - Regarding CAD, he has been asymptomatic, though has not been very active.  I recommended increasing his activity level and we will decide whether to start antianginal therapy based on his symptoms.  Will start aspirin  81 mg daily, increase his rosuvastatin  to 20 mg daily and check LPA given premature ASCVD.  He has an appointment with lipid clinic later this month.  He will need repeat lipid panel in 3 months. - Follow-up with me in 3 months.   Signed, Joelle VEAR Ren Donley, MD  01/30/2024 11:56 AM    [facility]

## 2024-01-27 NOTE — Patient Instructions (Addendum)
 I do think reflux is causing some of your symptom(s)- lets stop the OTC (available over the counter without a prescription) version and start omeprazole/Prilosec 40 mg daily for the next 1-2 months and if symptom(s) resolve can go back down to the 20 mg OTC (available over the counter without a prescription) version  Taking the medicine as directed and not missing any doses is one of the best things you can do to treat your anxiety sertraline 25 mg.  Here are some things to keep in mind:  Side effects (stomach upset, some increased anxiety) may happen before you notice a benefit.  These side effects typically go away over time. Changes to your dose of medicine or a change in medication all together is sometimes necessary Most people need to be on medication at least 6-12 months, in your case suspect long term Many people will notice an improvement within two weeks but the full effect of the medication can take up to 6 weeks Stopping the medication when you start feeling better often results in a return of symptoms If you start having thoughts of hurting yourself or others after starting this medicine, call our office immediately at [phone] or seek care through 911.    As always- quit smoking  We have placed a referral for you today to psychiatry- please call their # if you do not hear within a week (may be listed below or you may see mychart message within a few days with #).    Recommended follow up: Return in about 3 weeks (around 02/17/2024) for followup or sooner if needed.Schedule b4 you leave.

## 2024-01-30 ENCOUNTER — Encounter: Payer: Self-pay | Admitting: Family Medicine

## 2024-01-30 ENCOUNTER — Ambulatory Visit

## 2024-01-30 VITALS — BP 120/72 | HR 93 | Ht 72.0 in | Wt 176.0 lb

## 2024-01-30 DIAGNOSIS — I2510 Atherosclerotic heart disease of native coronary artery without angina pectoris: Secondary | ICD-10-CM | POA: Diagnosis not present

## 2024-01-30 DIAGNOSIS — I10 Essential (primary) hypertension: Secondary | ICD-10-CM

## 2024-01-30 DIAGNOSIS — F17200 Nicotine dependence, unspecified, uncomplicated: Secondary | ICD-10-CM

## 2024-01-30 DIAGNOSIS — R0789 Other chest pain: Secondary | ICD-10-CM | POA: Diagnosis not present

## 2024-01-30 DIAGNOSIS — Z79899 Other long term (current) drug therapy: Secondary | ICD-10-CM

## 2024-01-30 DIAGNOSIS — E785 Hyperlipidemia, unspecified: Secondary | ICD-10-CM

## 2024-01-30 MED ORDER — ROSUVASTATIN CALCIUM 20 MG PO TABS: 20.0000 mg | ORAL_TABLET | Freq: Every day | ORAL | 3 refills | Status: AC

## 2024-01-30 MED ORDER — ASPIRIN 81 MG PO TBEC: 81.0000 mg | DELAYED_RELEASE_TABLET | Freq: Every day | ORAL | Status: AC

## 2024-01-30 NOTE — Patient Instructions (Addendum)
 Medication Instructions:  START: Aspirin  81 mg daily (Over the Counter) Increase: Rosuvastatin  20 mg daily *If you need a refill on your cardiac medications before your next appointment, please call your pharmacy*  Lab Work: TODAY: Lpa 3 mo: Lipid If you have labs (blood work) drawn today and your tests are completely normal, you will receive your results only by: MyChart Message (if you have MyChart) OR A paper copy in the mail If you have any lab test that is abnormal or we need to change your treatment, we will call you to review the results.  Testing/Procedures: NONE  Follow-Up: At [facility], you and your health needs are our priority.  As part of our continuing mission to provide you with exceptional heart care, our providers are all part of one team.  This team includes your primary Cardiologist (physician) and Advanced Practice Providers or APPs (Physician Assistants and Nurse Practitioners) who all work together to provide you with the care you need, when you need it.  Your next appointment:   3 month(s)  Provider:   Dr Ren

## 2024-01-31 ENCOUNTER — Telehealth: Payer: Self-pay | Admitting: Family Medicine

## 2024-01-31 LAB — LIPID PANEL
Chol/HDL Ratio: 4.1 ratio (ref 0.0–5.0)
Cholesterol, Total: 190 mg/dL (ref 100–199)
HDL: 46 mg/dL (ref >39)
LDL Chol Calc (NIH): 122 mg/dL — ABNORMAL HIGH (ref 0–99)
Triglycerides: 120 mg/dL (ref 0–149)
VLDL Cholesterol Cal: 22 mg/dL (ref 5–40)

## 2024-01-31 NOTE — Telephone Encounter (Signed)
 [place] tax inspector provide statement, to be filled out by provider. Patient requested to send it back via Fax within ASAP. Document is located in providers tray at front office.Please advise at [phone].

## 2024-02-01 NOTE — Telephone Encounter (Signed)
 Given to dr. Katrinka for review.

## 2024-02-02 ENCOUNTER — Telehealth: Payer: Self-pay

## 2024-02-02 ENCOUNTER — Ambulatory Visit
Admission: RE | Admit: 2024-02-02 | Discharge: 2024-02-02 | Disposition: A | Discharge status: Home / Self Care | Source: Ambulatory Visit | Attending: Urology | Admitting: Urology

## 2024-02-02 DIAGNOSIS — C61 Malignant neoplasm of prostate: Secondary | ICD-10-CM

## 2024-02-02 MED ORDER — GADOPICLENOL 0.5 MMOL/ML IV SOLN
8.0000 mL | Freq: Once | INTRAVENOUS | Status: AC | PRN
  Administered 2024-02-02: 8 mL via INTRAVENOUS

## 2024-02-02 NOTE — Telephone Encounter (Signed)
 Copied from CRM #[id number]. Topic: Clinical - Medication Question >> Feb 02, 2024  1:33 PM Suzen RAMAN wrote: Reason for CRM: Patient had a MRI completed today and took Valium today and would like to know if he should resume rosuvastatin  (CRESTOR ) 20 MG tablet,  sertraline (ZOLOFT) 25 MG tablet and aspirin  EC 81 MG tablet. Please called patient partner to advise. If unable to reach please send message via MyChart   Cathryne Corn CB#[phone]

## 2024-02-02 NOTE — Telephone Encounter (Signed)
 Yes he should start those medicines

## 2024-02-03 NOTE — Telephone Encounter (Signed)
 Called pt and left detailed vm advising pt he should resume medications. Advised cb number if needing to return my call here in the office

## 2024-02-08 ENCOUNTER — Ambulatory Visit (HOSPITAL_BASED_OUTPATIENT_CLINIC_OR_DEPARTMENT_OTHER): Admitting: Internal Medicine

## 2024-02-08 ENCOUNTER — Encounter (HOSPITAL_BASED_OUTPATIENT_CLINIC_OR_DEPARTMENT_OTHER): Payer: Self-pay | Admitting: Internal Medicine

## 2024-02-08 VITALS — BP 124/76 | HR 90 | Ht 72.0 in | Wt 175.4 lb

## 2024-02-08 DIAGNOSIS — E785 Hyperlipidemia, unspecified: Secondary | ICD-10-CM

## 2024-02-08 DIAGNOSIS — I2510 Atherosclerotic heart disease of native coronary artery without angina pectoris: Secondary | ICD-10-CM | POA: Diagnosis not present

## 2024-02-08 NOTE — Telephone Encounter (Signed)
 Please review message from patient and advise on next steps and if something will need to be sent in. Please and thank you

## 2024-02-08 NOTE — Patient Instructions (Signed)
 Medication Instructions:   Your physician recommends that you continue on your current medications as directed. Please refer to the Current Medication list given to you today.  *If you need a refill on your cardiac medications before your next appointment, please call your pharmacy*  Lab Work: Your physician recommends that you return for lab work in: TODAY   Lp(a)  If you have labs (blood work) drawn today and your tests are completely normal, you will receive your results only by: MyChart Message (if you have MyChart) OR A paper copy in the mail If you have any lab test that is abnormal or we need to change your treatment, we will call you to review the results.    Follow-Up:  AS NEEDED WITH DR. HILTY

## 2024-02-09 ENCOUNTER — Ambulatory Visit
Admission: RE | Admit: 2024-02-09 | Discharge: 2024-02-09 | Disposition: A | Discharge status: Home / Self Care | Source: Ambulatory Visit | Attending: Family Medicine | Admitting: Family Medicine

## 2024-02-09 ENCOUNTER — Ambulatory Visit (INDEPENDENT_AMBULATORY_CARE_PROVIDER_SITE_OTHER): Admitting: Behavioral Health

## 2024-02-09 DIAGNOSIS — R42 Dizziness and giddiness: Secondary | ICD-10-CM

## 2024-02-09 DIAGNOSIS — F411 Generalized anxiety disorder: Secondary | ICD-10-CM | POA: Diagnosis not present

## 2024-02-09 MED ORDER — GADOPICLENOL 0.5 MMOL/ML IV SOLN
9.0000 mL | Freq: Once | INTRAVENOUS | Status: AC | PRN
  Administered 2024-02-09: 9 mL via INTRAVENOUS

## 2024-02-09 NOTE — Progress Notes (Addendum)
 Phot[facility] Health Counselor Initial Adult Exam  Name: Daryl Pace Date: 02/09/2024 MRN: [id number] DOB: [birth year] PCP: Katrinka Garnette KIDD, MD  Time spent: 60 min Caregility video; Pt is home w/privacy & Provider working remotely from [employer]. Pt is aware of the risks/limitations of telehealth & consents to Tx today. Time In: 10:00am Time Out: 11:00am  Guardian/Payee:  [employer]    Paperwork requested: No   Reason for Visit /Presenting Problem: Elevated anx/dep & stress  Mental Status Exam: Appearance:   Casual     Behavior:  Appropriate and Sharing  Motor:  Normal  Speech/Language:   Clear and Coherent  Affect:  Appropriate  Mood:  normal  Thought process:  normal  Thought content:    WNL  Sensory/Perceptual disturbances:    WNL  Orientation:  oriented to person, place, time/date, and situation  Attention:  Good  Concentration:  Good  Memory:  WNL  Fund of knowledge:   Good  Insight:    Good  Judgment:   Good  Impulse Control:  Good    Risk Assessment: Danger to Self:  No Self-injurious Behavior: No Danger to Others: No Duty to Warn:no Physical Aggression / Violence:No  Access to Firearms a concern: No  Gang Involvement:No  Patient / guardian was educated about steps to take if suicide or homicide risk level increases between visits: yes; appropriate to ICD process While future psychiatric events cannot be accurately predicted, the patient does not currently require acute inpatient psychiatric care and does not currently meet [place]  involuntary commitment criteria.  Substance Abuse History: Current substance abuse: No     Past Psychiatric History:   Previous psychological history is significant for anxiety Outpatient Providers: Dr. Garnette Katrinka, MD History of Psych Hospitalization: No  Psychological Testing: NA   Abuse History:  Victim of: No., NA   Report needed: No. Victim of Neglect:No. Perpetrator of NA  Witness / Exposure to  Domestic Violence: No   Protective Services Involvement: No  Witness to [employer] Violence:  No   Family History:  Family History  Problem Relation Age of Onset   Stroke Mother        early 95s, drop foot   Hypertension Mother    Diabetes Mother    Hyperlipidemia Mother    Hyperlipidemia Brother    Hypertension Brother    Breast cancer Maternal Aunt     Living situation: the patient lives with their partner; Cathryne Corn  Sexual Orientation: Straight  Relationship Status: co-habitating  Name of spouse / other: Cathryne Corn If a parent, number of children / ages: 79yo Jasmine who is in Grad Sch @ [employer] for Art Therapy  Support Systems: significant other Friends - we socialize over the phone only Parents - Mom lives in [place] w/Str 47yo Brandy 2 Bros; Reggie (in [place] & 48yo) & Mallissa Designer, Fashion/clothing & 48yo)  Financial Stress:  None mentioned today  Income/Employment/Disability: Engineer, 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   Educational History:  Education: financial trader  Religion/Sprituality/World View: Unk  Any cultural differences that may affect / interfere with treatment:  None noted today  Recreation/Hobbies: music  Stressors: Health problems   Loss of current FT working status w/[employer] after 10 yrs & the uncertainty of understanding his body & mind @ this time    Strengths: Supportive Relationships, Family, Friends, Hopefulness, Journalist, Newspaper, Able to Communicate Effectively, and attending psychotherapy @ this time to help himself  Barriers:  None reported today  Legal History: Pending legal issue / charges: The patient has no significant history of legal issues. 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 Swallow whole.     hydrOXYzine  (ATARAX ) 25 MG tablet Take 0.5-1 tablets (12.5-25 mg total) by mouth every 8 (eight) hours as needed for anxiety. 60 tablet 2   omeprazole  (PRILOSEC) 40 MG capsule Take 1 capsule (40 mg total) by mouth daily. 30 capsule 3   rosuvastatin  (CRESTOR ) 20 MG tablet Take 1 tablet (20 mg total) by mouth daily. 90 tablet 3   sertraline  (ZOLOFT ) 25 MG tablet Take 1 tablet (25 mg total) by mouth daily. 30 tablet 3   Simethicone (GAS-X PO) Take 1 tablet by mouth as needed.     No current facility-administered medications for this visit.    No Known Allergies  Diagnoses:  GAD (generalized anxiety disorder)  Plan of Care: Court will attend all sessions as scheduled every 2-3 wks. He will track his progress using the best means possible for him & look for feedback from his Sig Other as she is supportive & helpful. We will meet again on Mon, Dec 1st on Caregility. We will review our Tx Plan & make appropriate steps to inc his driving time & exposure to the Hwy w/in a safe means so he can travel to work.  Target Date: 02/20/2024  Progress: 5  Frequency: Once every 2-3 wks  Modality: Kennis Richerd LITTIE Hollace, LMFT     "

## 2024-02-09 NOTE — Progress Notes (Signed)
 LIPID CLINIC CONSULT NOTE  Chief Complaint:  Manage dyslipidemia  Primary Care Physician: Katrinka Garnette KIDD, MD  Primary Cardiologist:  None  HPI:  Daryl Pace is a 48 y.o. male who is being seen today for the evaluation of dyslipidemia at the request of Azobou Donley Joelle DEL*.  This is a pleasant 48 year old male recently referred to the lipid clinic for evaluation management of dyslipidemia.  This is in the context of an abnormal cardiac CT.  He had a coronary calcium  score in October 2022 which was 0 and recently however had been seen in the emergency department for chest pain.  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  He saw my partner Dr. Deneise who advised adding aspirin  and increasing his rosuvastatin  from 10 to 20 mg daily.  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 He has a 16.8 pack-year smoking history. He has been exposed to tobacco smoke. He has never used smokeless tobacco. He reports current alcohol use of about 7.0 - 12.0 standard  drinks of alcohol per week. He reports that he does not use drugs.  ALLERGIES:  No Known Allergies  ROS: Pertinent items noted in HPI and remainder of comprehensive ROS otherwise negative.  HOME MEDS: Current Outpatient Medications on File Prior to Visit  Medication Sig Dispense Refill   aspirin  EC 81 MG tablet Take 1 tablet (81 mg total) by mouth daily. Swallow whole.     hydrOXYzine  (ATARAX ) 25 MG tablet Take 0.5-1 tablets (12.5-25 mg total) by mouth every 8 (eight) hours as needed for anxiety. 60 tablet 2   omeprazole  (PRILOSEC) 40 MG capsule Take 1 capsule (40 mg total) by mouth daily. 30 capsule 3   rosuvastatin  (CRESTOR ) 20 MG tablet Take 1 tablet (20 mg total) by mouth daily. 90 tablet 3   sertraline  (ZOLOFT ) 25 MG tablet Take 1 tablet (25 mg total) by mouth daily. 30 tablet 3   Simethicone (GAS-X PO) Take 1 tablet by mouth as needed.     No current facility-administered medications on file prior to visit.    LABS/IMAGING: No results found for this or any previous visit (from the past 48 hours). No results found.  LIPID PANEL:    Component Value Date/Time   CHOL 190 01/30/2024 1307   TRIG 120 01/30/2024 1307   HDL 46 01/30/2024  1307   CHOLHDL 4.1 01/30/2024 1307   CHOLHDL 6 07/12/2023 0945   VLDL 43.8 (H) 07/12/2023 0945   LDLCALC 122 (H) 01/30/2024 1307   LDLDIRECT 221.0 11/25/2022 0910    No results found for: LIPOA   WEIGHTS: Wt Readings from Last 3 Encounters:  02/08/24 175 lb 6.4 oz (79.6 kg)  01/30/24 176 lb (79.8 kg)  01/27/24 171 lb 9.6 oz (77.8 kg)    VITALS: BP 124/76 (BP Location: Right Arm, Patient Position: Sitting, Cuff Size: Normal)   Pulse 90   Ht 6' (1.829 m)   Wt 175 lb 6.4 oz (79.6 kg)   SpO2 96%   BMI 23.79 kg/m   EXAM: Deferred  EKG: Deferred  ASSESSMENT: 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 Possible familial  hyperlipidemia, untreated LDL greater than 190 Goal LDL less than 70  PLAN: 1.   Mr. Dupuis recently had an increase in his statin therapy to appropriate high intensity.  He is now on aspirin .  I agree with assessing for LP(a).  I think he has received excellent care from my partner and should continue to follow-up with him regarding his cardiology and lipid management recommendations.  Follow-up as needed  Daryl KYM Maxcy, MD, [facility], FNLA, FACP    [facility]  Medical Director of the Advanced Lipid Disorders &  Cardiovascular Risk Reduction Clinic Diplomate of the American Board of Clinical Lipidology Attending Cardiologist  Direct Dial: [phone]  Fax: [phone]  Website:  [email or web address] Daryl Pace 02/09/2024, 12:01 PM

## 2024-02-10 ENCOUNTER — Ambulatory Visit: Payer: Self-pay

## 2024-02-10 DIAGNOSIS — E7841 Elevated Lipoprotein(a): Secondary | ICD-10-CM

## 2024-02-10 LAB — LIPOPROTEIN A (LPA): Lipoprotein (a): 446.3 nmol/L — ABNORMAL HIGH (ref <75.0)

## 2024-02-13 ENCOUNTER — Telehealth: Payer: Self-pay | Admitting: *Deleted

## 2024-02-13 NOTE — Progress Notes (Unsigned)
 Care Guide Pharmacy Note  02/13/2024 Name: Daryl Pace MRN: [id number] DOB: 08-19-1975  Referred By: Katrinka Garnette KIDD, MD Reason for referral: Call Attempt #1 and Complex Care Management (Outreach to schedule referral pharmacist )   Daryl Pace is a 48 y.o. year old male who is a primary care patient of Katrinka Garnette KIDD, MD.  Daryl Pace was referred to the pharmacist for assistance related to: HLD  An unsuccessful telephone outreach was attempted today to contact the patient who was referred to the pharmacy team for assistance with medication assistance. Additional attempts will be made to contact the patient.  Daryl Pace, CMA [place]  [facility], [facility] Guide Direct Dial: [phone]  Fax: [phone] Website: [email or web address]

## 2024-02-14 ENCOUNTER — Ambulatory Visit: Admitting: Family Medicine

## 2024-02-14 NOTE — Progress Notes (Signed)
 Care Guide Pharmacy Note  02/14/2024 Name: Olney Monier MRN: [id number] DOB: 11-Dec-1975  Referred By: Katrinka Garnette KIDD, MD Reason for referral: Call Attempt #1 and Complex Care Management (Outreach to schedule referral pharmacist )   Darina Ponciano Gaughan is a 48 y.o. year old male who is a primary care patient of Katrinka Garnette KIDD, MD.  Darina Ponciano Novak was referred to the pharmacist for assistance related to: HLD  Successful contact was made with the patient to discuss pharmacy services including being ready for the pharmacist to call at least 5 minutes before the scheduled appointment time and to have medication bottles and any blood pressure readings ready for review. The patient agreed to meet with the pharmacist via telephone visit on 02/17/2024  Thedford Franks, CMA [place]  [facility], [facility] Guide Direct Dial: [phone]  Fax: [phone] Website: [email or web address]

## 2024-02-17 ENCOUNTER — Encounter: Payer: Self-pay | Admitting: Pharmacist

## 2024-02-17 ENCOUNTER — Other Ambulatory Visit: Payer: Self-pay | Admitting: Pharmacist

## 2024-02-17 NOTE — Progress Notes (Signed)
 02/17/2024 Name: Daryl Pace MRN: [id number] DOB: 29-Feb-1976  Chief Complaint  Patient presents with   Hyperlipidemia    [name] is a 48 y.o. year old male who presented for a telephone visit.   They were referred to the pharmacist by cardiologist - Dr Deneise for assistance in managing hypertension.    Subjective:  Care Team: Primary Care Provider: Katrinka Garnette KIDD, MD ; Next Scheduled Visit: 02/28/2024 Cardiologist: Dr Ren - Donley and Dr Mona; Next Scheduled Visit: 04/23/2024  Medication Access/Adherence  Current Pharmacy:  [id number] [address], [place] - [address] [place] [address] Phone: [phone] Fax: [phone]   Patient reports affordability concerns with their medications: No  Patient reports access/transportation concerns to their pharmacy: No  Patient reports adherence concerns with their medications:  Yes  - he states after the first few doses of sertraline  he felt like a zombie; he just sat in a chair all day. He reported even his family commented on this. He was prescribed sertraline  25mg  daily and hydroxyzine  to use if needed for anxiety. He has not needed hydroxyzine .    Hyperlipidemia/ASCVD Risk Reduction  Current lipid lowering medications: rosuvastatin  20mg  daily   Medications tried in the past: none  Antiplatelet regimen:  aspirin  81mg  daily - started a few weeks ago. Patient mentions he has noticed that he is bruising easier.   ASCVD History: CAD Family History: mother has stroke in her early 42's; she also has diabetes, hypertension and hyperlipidemia. Brother Mallissa has hypertension and hyperlipidemia  Risk Factors: smoker, elevated LDL and elevated Lpa   Objective:  Lab Results  Component Value Date   HGBA1C 5.9 07/12/2023    Lab Results  Component Value Date   CREATININE 1.05 12/13/2023   BUN 19 12/13/2023   NA 141 12/13/2023   K 4.1 12/13/2023   CL 104  12/13/2023   CO2 23 12/13/2023    Lab Results  Component Value Date   CHOL 190 01/30/2024   HDL 46 01/30/2024   LDLCALC 122 (H) 01/30/2024   LDLDIRECT 221.0 11/25/2022   TRIG 120 01/30/2024   CHOLHDL 4.1 01/30/2024    Medications Reviewed Today     Reviewed by Carla Milling, RPH-CPP (Pharmacist) on 02/17/24 at 1015  Med List Status: <None>   Medication Order Taking? Sig Documenting Provider Last Dose Status Informant  aspirin  EC 81 MG tablet [id number] Yes Take 1 tablet (81 mg total) by mouth daily. Swallow whole. Azobou Tonleu, Joelle DEL, MD  Active   hydrOXYzine  (ATARAX ) 25 MG tablet [id number] Yes Take 0.5-1 tablets (12.5-25 mg total) by mouth every 8 (eight) hours as needed for anxiety. Katrinka Garnette KIDD, MD  Active   omeprazole  [facility]) 40 MG capsule [id number] Yes Take 1 capsule (40 mg total) by mouth daily. Katrinka Garnette KIDD, MD  Active   rosuvastatin  (CRESTOR ) 20 MG tablet [id number] Yes Take 1 tablet (20 mg total) by mouth daily. Azobou Tonleu, Joelle DEL, MD  Active   sertraline  (ZOLOFT ) 25 MG tablet [id number] Yes Take 1 tablet (25 mg total) by mouth daily. Katrinka Garnette KIDD, MD  Active   Simethicone (GAS-X PO) [id number] Yes Take 1 tablet by mouth as needed. [provider]  Active               Assessment/Plan:   Hyperlipidemia/ASCVD Risk Reduction: Currently uncontrolled. LDL goal < 55 - Reviewed long term complications of uncontrolled cholesterol - Discussed PCSK9 agents. Patient is hesitant to  start due to dislike of needles. Sent him links to review what Repatha and Praluent look like and how they are injected.  - Checked with his [facility] plan - Repatha is covered - $75 / 30 days. He should be able to use a copay card to get cost down to $15 / month - Leqvio which is given every 6 months after the first 2 doses would be an option if he prefers not to self administer an injection but Leqvio might be harder to get covered by his insurance.     Medication Management: - Will notify PCP that patient has not been able to tolerate the full dose of sertraline  25mg . Could try half tablet daily or switch to different agent. Next appt with PCP is 02/28/2024 - Plan to work on smoking cessation when we can find an agent that will help with patient's anxiety. Bupropion or Chantix sometimes are helpful for anxiety but I have also seen it worsen anxiety.     Follow Up Plan: 1 week.   Madelin Ray, PharmD Clinical Pharmacist [facility] Primary Care  Population Health [phone]

## 2024-02-18 ENCOUNTER — Other Ambulatory Visit: Payer: Self-pay | Admitting: Family Medicine

## 2024-02-20 ENCOUNTER — Encounter: Payer: Self-pay | Admitting: Neurology

## 2024-02-20 ENCOUNTER — Ambulatory Visit: Admitting: Behavioral Health

## 2024-02-20 DIAGNOSIS — F411 Generalized anxiety disorder: Secondary | ICD-10-CM | POA: Diagnosis not present

## 2024-02-20 NOTE — Progress Notes (Signed)
 [facility] Counselor/Therapist Progress Note  Patient ID: Daryl Pace, MRN: [id number],    Date: 02/20/2024  Time Spent: 50 min Caregility video; Pt is home in private & Provider working remotely from [employer]. Pt is aware of the risks/limitations of telehealth & consents to Tx today.  Time In: 11:00am Time Out: 11:50am  Treatment Type: Individual Therapy  Reported Symptoms: Elevated anx/dep  Mental Status Exam: Appearance:  Casual     Behavior: Appropriate & Sharing  Motor: Normal  Speech/Language:  Clear and Coherent  Affect: Appropriate  Mood: normal  Thought process: normal  Thought content:   WNL  Sensory/Perceptual disturbances:   WNL  Orientation: oriented to person, place, time/date, and situation  Attention: Good  Concentration: Good  Memory: WNL  Fund of knowledge:  Good  Insight:   Good  Judgment:  Good  Impulse Control: Good   Risk Assessment: Danger to Self:  No Self-injurious Behavior: No Danger to Others: No Duty to Warn:no Physical Aggression / Violence:No  Access to Firearms a concern: No  Gang Involvement:No   Subjective: Pt is still having trouble driving to unfamiliar places, esp'ly after dark on winding roads.    Interventions: Psycho-education/Bibliotherapy and Interpersonal  Diagnosis:GAD (generalized anxiety disorder)  Plan: Daryl Pace will do research on motion sickness & find out as much as he can about this issue. He will explore w/Daryl Pace the questions he has about the PCSK9i injections for his elevated Lipoprotein A levels. Daryl Pace will monitor & recognize when he feels uneasy, so he can keep himself safe. He will stretch a little in his car everytime he goes out (one block further than his destination), so he feels more familiar w/the territory. He will pull off the road if he feels trapped or chlostrophobic-like Sx. He will report back @ our next session on his progress.   Target Date: 03/05/2024  Progress:  5  Frequency: Once every 2-3 wks  Modality: Kennis Richerd LITTIE Hollace, LMFT

## 2024-02-20 NOTE — Telephone Encounter (Signed)
 No other recommendations.  Does not qualify for OCEAN (a) trial.  Repatha and Leqvio show lowering of Lp(a) in some, but not all, patients.

## 2024-02-21 NOTE — Telephone Encounter (Signed)
I have not received anything.

## 2024-02-24 ENCOUNTER — Other Ambulatory Visit: Payer: Self-pay | Admitting: Pharmacist

## 2024-02-24 MED ORDER — REPATHA SURECLICK 140 MG/ML ~~LOC~~ SOAJ: 140.0000 mg | SUBCUTANEOUS | 2 refills | Status: DC

## 2024-02-24 NOTE — Progress Notes (Signed)
 02/24/2024 Name: Daryl Pace MRN: [id number] DOB: 11/16/1975  Chief Complaint  Patient presents with   Hyperlipidemia    Daryl Pace is a 48 y.o. year old male who presented for a telephone visit.   They were referred to the pharmacist by cardiologist - Dr Deneise for assistance in managing hyperlipidemia/cardiovascular risk reduction.    Subjective:  Care Team: Primary Care Provider: Katrinka Garnette KIDD, MD ; Next Scheduled Visit: 02/28/2024 Cardiologist: Dr Ren - Donley and Dr Mona; Next Scheduled Visit: 04/23/2024  Medication Access/Adherence  Current Pharmacy:  [id number] [address], [place] - [address] [place] [address] Phone: [phone] Fax: [phone]   Patient reports affordability concerns with their medications: No  Patient reports access/transportation concerns to their pharmacy: No  Patient reports adherence concerns with their medications:  Yes  - Not currently taking sertraline  because after the first few doses of sertraline  he felt like a zombie; Dr Katrinka plans to discuss alternative with him at 12/9 appointment. Patient has met with counselor for anxiety.     Hyperlipidemia/ASCVD Risk Reduction  Current lipid lowering medications: rosuvastatin  20mg  daily   Medications tried in the past: none  Antiplatelet regimen:  aspirin  81mg  daily - started a few weeks ago. Patient mentions he has noticed that he is bruising easier.   ASCVD History: CAD Family History: mother has stroke in her early 53's; she also has diabetes, hypertension and hyperlipidemia. Brother Mallissa has hypertension and hyperlipidemia  Risk Factors: smoker, elevated LDL and elevated Lpa   Objective:  Lab Results  Component Value Date   HGBA1C 5.9 07/12/2023    Lab Results  Component Value Date   CREATININE 1.05 12/13/2023   BUN 19 12/13/2023   NA 141 12/13/2023   K 4.1 12/13/2023   CL 104 12/13/2023   CO2 23  12/13/2023    Lab Results  Component Value Date   CHOL 190 01/30/2024   HDL 46 01/30/2024   LDLCALC 122 (H) 01/30/2024   LDLDIRECT 221.0 11/25/2022   TRIG 120 01/30/2024   CHOLHDL 4.1 01/30/2024    Medications Reviewed Today     Reviewed by Carla Milling, RPH-CPP (Pharmacist) on 02/24/24 at [phone]  Med List Status: <None>   Medication Order Taking? Sig Documenting Provider Last Dose Status Informant  aspirin  EC 81 MG tablet [id number] Yes Take 1 tablet (81 mg total) by mouth daily. Swallow whole. Azobou Tonleu, Joelle DEL, MD  Active   hydrOXYzine  (ATARAX ) 25 MG tablet [id number]  Take 0.5-1 tablets (12.5-25 mg total) by mouth every 8 (eight) hours as needed for anxiety. Katrinka Garnette KIDD, MD  Active   omeprazole  [facility]) 40 MG capsule [id number] Yes Take 1 capsule (40 mg total) by mouth daily. Katrinka Garnette KIDD, MD  Active   rosuvastatin  (CRESTOR ) 20 MG tablet [id number] Yes Take 1 tablet (20 mg total) by mouth daily. Azobou Donley Joelle DEL, MD  Active   sertraline  (ZOLOFT ) 25 MG tablet [id number]  TAKE 1 TABLET (25 MG TOTAL) BY MOUTH DAILY.  Patient not taking: Reported on 02/24/2024   Katrinka Garnette KIDD, MD  Active   Simethicone (GAS-X PO) [id number] Yes Take 1 tablet by mouth as needed. [provider]  Active               Assessment/Plan:   Hyperlipidemia/ASCVD Risk Reduction: Currently uncontrolled. LDL goal < 55 - Reviewed long term complications of uncontrolled cholesterol - Start Repatha  140mg  every 14 days. Will work with  [facility]  - Assisted patient with enrolling in Repatha  copay card program.  - Patient will bring in Repatha  to appt with Dr Katrinka 12/9 and I will provide injection education.   Follow Up Plan: 1 week.   Madelin Ray, PharmD Clinical Pharmacist [facility] [phone]   02/24/2024 - Addendum [facility] to [facility] - cost to patient was $15 after  copay card.   Madelin Ray, PharmD Clinical Pharmacist

## 2024-02-27 ENCOUNTER — Telehealth: Payer: Self-pay | Admitting: Family Medicine

## 2024-02-27 ENCOUNTER — Ambulatory Visit

## 2024-02-27 NOTE — Telephone Encounter (Signed)
 Patient had to reschedule OV from 02/28/24 to 03/05/24 due to office opening @ 10am due to inclement weather. Patient is fine with this change but wanted to know what to do about repatha  injection that he was supposed to receive from Sterling Eckard in office. Please advise.

## 2024-02-28 ENCOUNTER — Other Ambulatory Visit: Payer: Self-pay | Admitting: Pharmacist

## 2024-02-28 ENCOUNTER — Encounter: Payer: Self-pay | Admitting: Family Medicine

## 2024-02-28 ENCOUNTER — Other Ambulatory Visit: Admitting: Pharmacist

## 2024-02-28 ENCOUNTER — Ambulatory Visit: Payer: Self-pay | Admitting: Family Medicine

## 2024-02-28 NOTE — Progress Notes (Signed)
   02/28/2024 Name: Daryl Pace MRN: [id number] DOB: 25-Sep-1975  Chief Complaint  Patient presents with   Hyperlipidemia    Daryl Pace is a 49 y.o. year old male who presented for an in office visit today   They were referred to the pharmacist by cardiologist - Dr Deneise for assistance in managing hyperlipidemia/cardiovascular risk reduction.    Subjective:  Care Team: Primary Care Provider: Katrinka Garnette KIDD, MD ; Next Scheduled Visit: 03/06/2024 Cardiologist: Dr Ren - Donley and Dr Mona; Next Scheduled Visit: 04/23/2024  Medication Access/Adherence  Current Pharmacy:  [id number] [address], [place] - [address] [place] [address] Phone: [phone] Fax: [phone]   Patient reports affordability concerns with their medications: No  Patient reports access/transportation concerns to their pharmacy: No  Patient reports adherence concerns with their medications:  Yes  - Not currently taking sertraline  because after the first few doses of sertraline  he felt like a zombie; Dr Katrinka plans to discuss alternative with him at 12/16 appointment. Patient has met with counselor for anxiety.     Hyperlipidemia/ASCVD Risk Reduction  Current lipid lowering medications: rosuvastatin  20mg  daily   Medications tried in the past: none  Antiplatelet regimen:  aspirin  81mg  daily - started a few weeks ago. Patient mentions he has noticed that he is bruising easier.   ASCVD History: CAD Family History: mother has stroke in her early 19's; she also has diabetes, hypertension and hyperlipidemia. Brother Mallissa has hypertension and hyperlipidemia  Risk Factors: smoker, elevated LDL and elevated Lpa   Objective:  Lab Results  Component Value Date   HGBA1C 5.9 07/12/2023    Lab Results  Component Value Date   CREATININE 1.05 12/13/2023   BUN 19 12/13/2023   NA 141 12/13/2023   K 4.1 12/13/2023   CL 104 12/13/2023   CO2  23 12/13/2023    Lab Results  Component Value Date   CHOL 190 01/30/2024   HDL 46 01/30/2024   LDLCALC 122 (H) 01/30/2024   LDLDIRECT 221.0 11/25/2022   TRIG 120 01/30/2024   CHOLHDL 4.1 01/30/2024    Medications Reviewed Today   Medications were not reviewed in this encounter       Assessment/Plan:   Hyperlipidemia/ASCVD Risk Reduction: Currently uncontrolled. LDL goal < 55 - Reviewed long term complications of uncontrolled cholesterol - Education provided on how to inject Repatha . Patient self administered the first dose in the office without any issues. Discussed how to store Repatha  until his next dose in 14 days. Discussed possible side effects and what to expect to know if the medication is working.    Follow Up Plan: will see Dr Ren Donley in Feb 2026 for follow up He has my contact information incase he has questions about medications or cost in the future.  Madelin Ray, PharmD Clinical Pharmacist [facility]  Population Health [phone]

## 2024-02-28 NOTE — Telephone Encounter (Signed)
 Called patient - rescheduled for 12/09 at 2pm

## 2024-03-02 NOTE — Telephone Encounter (Signed)
 Please review

## 2024-03-05 ENCOUNTER — Ambulatory Visit: Payer: Self-pay | Admitting: Family Medicine

## 2024-03-05 ENCOUNTER — Ambulatory Visit: Admitting: Behavioral Health

## 2024-03-05 VITALS — BP 128/70 | HR 108 | Temp 97.9°F | Ht 72.0 in | Wt 174.4 lb

## 2024-03-05 DIAGNOSIS — R002 Palpitations: Secondary | ICD-10-CM | POA: Diagnosis not present

## 2024-03-05 DIAGNOSIS — I2510 Atherosclerotic heart disease of native coronary artery without angina pectoris: Secondary | ICD-10-CM

## 2024-03-05 DIAGNOSIS — F411 Generalized anxiety disorder: Secondary | ICD-10-CM

## 2024-03-05 DIAGNOSIS — F54 Psychological and behavioral factors associated with disorders or diseases classified elsewhere: Secondary | ICD-10-CM

## 2024-03-05 DIAGNOSIS — R42 Dizziness and giddiness: Secondary | ICD-10-CM | POA: Diagnosis not present

## 2024-03-05 DIAGNOSIS — R209 Unspecified disturbances of skin sensation: Secondary | ICD-10-CM

## 2024-03-05 NOTE — Patient Instructions (Addendum)
 Trial half of sertraline  12.5 mg  Call behavioral health back from 02/29/24 message [facility] Address: [address] [place] [address] Phone: [phone]  We have placed a referral for you today for ankle-brachial index (blood flow test with cold feet)- please call their # if you do not hear within a week (may be listed below or you may see mychart message within a few days with #).   Team please give him exercises for medial epicondylitis  I want you to do the exercise 3x a week for a month then once a week for another month. Stop any exercise that causes more than 1-2/10 pain increase. If not doing better within 1-2 months let us  refer you to sports medicine   Recommended follow up: Return in about 1 month (around 04/05/2024) for followup or sooner if needed.Schedule b4 you leave.    Taking the medicine as directed and not missing any doses is one of the best things you can do to treat your anxiety.  Here are some things to keep in mind:  Side effects (stomach upset, some increased anxiety) may happen before you notice a benefit.  These side effects typically go away over time. Changes to your dose of medicine or a change in medication all together is sometimes necessary Most people need to be on medication at least 6-12 months Many people will notice an improvement within two weeks but the full effect of the medication can take up to 4-6 weeks Stopping the medication when you start feeling better often results in a return of symptoms If you start having thoughts of hurting yourself or others after starting this medicine, call our office immediately at [phone] or seek care through 911.

## 2024-03-05 NOTE — Progress Notes (Signed)
 Phone [phone] In person visit   Subjective:   Daryl Pace is a 48 y.o. year old very pleasant male patient who presents for/with See problem oriented charting Chief Complaint  Patient presents with   Medical Management of Chronic Issues    3 week follow up; bilateral cold feet;     Past Medical History-  Patient Active Problem List   Diagnosis Date Noted   Current smoker 11/20/2014    Priority: High   Palpitations 03/31/2022    Priority: Medium    Eczema 05/22/2018    Priority: Medium    GAD (generalized anxiety disorder) 10/01/2017    Priority: Medium    Atypical chest pain 07/10/2015    Priority: Medium    Hyperlipidemia 03/06/2015    Priority: Medium    Hypertension     Priority: Medium    Plantar fibromatosis 11/22/2018    Priority: Low   CAD (coronary artery disease) 01/12/2024    Medications- reviewed and updated Current Outpatient Medications  Medication Sig Dispense Refill   aspirin  EC 81 MG tablet Take 1 tablet (81 mg total) by mouth daily. Swallow whole.     Evolocumab  (REPATHA  SURECLICK) 140 MG/ML SOAJ Inject 140 mg into the skin every 14 (fourteen) days. 2 mL 2   omeprazole  (PRILOSEC) 40 MG capsule Take 1 capsule (40 mg total) by mouth daily. 30 capsule 3   rosuvastatin  (CRESTOR ) 20 MG tablet Take 1 tablet (20 mg total) by mouth daily. 90 tablet 3   Simethicone (GAS-X PO) Take 1 tablet by mouth as needed.     sertraline  (ZOLOFT ) 25 MG tablet TAKE 1 TABLET (25 MG TOTAL) BY MOUTH DAILY. (Patient not taking: Reported on 03/05/2024) 90 tablet 2   No current facility-administered medications for this visit.     Objective:  BP 128/70 (BP Location: Left Arm, Patient Position: Sitting, Cuff Size: Normal)   Pulse (!) 108   Temp 97.9 F (36.6 C) (Temporal)   Ht 6' (1.829 m)   Wt 174 lb 6.4 oz (79.1 kg)   SpO2 95%   BMI 23.65 kg/m  Gen: NAD, resting comfortably CV: RRR no murmurs rubs or gallops Lungs: CTAB no crackles, wheeze, rhonchi Ext:  no edema, 2+ DP and PT pulses- feet are cold compared to hands despite recently having socks on Skin: warm, dry    Assessment and Plan   # Extensive note from 01/27/2024-essentially patient with atypical chest pain/palpitation/dizziness-ongoing coronary disease but reassuring echocardiogram and reassuring no arrhythmias on 10-day monitor.  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 Some gas pains still- gas x still helping - We were most probably concerned at that time that anxiety could be playing a role with his symptoms and his plan was to see psychology and psychologistin particular for severe related anxiety-he had been able to drive some short distances since last visit -Prior weight loss but largely stable at this point-actually up 3 pounds  # Anxiety S:Medication: Sertraline  25 mg with severe emotional restriction and reportedly zombie like.  We recommended  sertraline  12.5 mg consideration.  Prior to failure of Lexapro  and ineffectiveness of buspirone . - today reports significant palpitations still described as  a butterfly sensation as basic as going to to [employer] with daughter- reports can be better at least if he eats but doesn't get rid of symptom(s). No recent chest pain other than gas pains after meals. He's having to look to family members to have them drive at times. Saw eye doctor. Still feeling dizziness- worse if hungry. Sugar 99 this morning- no lows and no highs- we had wondered about hypoglycemia but doubt -See discussion above but I suspect a lot of his symptoms still are related to significant anxiety -Hydroxyzine  he reports heart racing even  on 1/3rd dose but was taking before MRI Counseling: had visit scheduled today virtual but provdier had to cancel- rescheduled fror 03/09/24    03/05/2024    4:01 PM 01/27/2024   10:28 AM 01/10/2024    9:33 AM 11/25/2022    8:09 AM  GAD 7 : Generalized Anxiety Score  Nervous, Anxious, on Edge 1 1 1 1   Control/stop worrying 1 1 1 1   Worry too much - different things 1 1 1 1   Trouble relaxing 0 0 1 0  Restless 0 0 0 0  Easily annoyed or irritable 1 1 0 0  Afraid - awful might happen 1 1 1 1   Total GAD 7 Score 5 5 5 4   Anxiety Difficulty Somewhat difficult Somewhat difficult Somewhat difficult Not difficult at all      03/05/2024    4:01 PM 01/27/2024   10:28 AM 01/10/2024    9:33 AM 11/25/2022    8:09 AM  GAD 7 : Generalized Anxiety Score  Nervous, Anxious, on Edge 1 1 1 1   Control/stop worrying 1 1 1 1   Worry too much - different things 1 1 1 1   Trouble relaxing 0 0 1 0  Restless 0 0 0 0  Easily annoyed or irritable 1 1 0 0  Afraid - awful might happen 1 1 1 1   Total GAD 7 Score 5 5 5 4   Anxiety Difficulty Somewhat difficult Somewhat difficult Somewhat difficult Not difficult at all   A/P: Anxiety remains poorly controlled-I still think he is under rating his GAD-7 and PHQ-9 especially with the level of intrusiveness in his life anxiety is taking.  He strongly wants to get back to work but still feels significant barriers - Nurse, learning disability with psychology soon - I am not overly confident this is going to be beneficial but we are going to try sertraline  12.5 mg - He missed the call from psychiatry and I gave him information to call them back -Close follow-up in 1 month.  He remains on short-term disability my hope has been to get him back within 1 to 3 months but I presently anticipate is going to be closer to the 91-month mark -each visit he seems to have a newer nonspecific symptom(s) such as feet coldness reported today and noted below or arm numbness- I think anxiety is playing  into this but he has real diease as well like CAD so we have discussed we still have to work these issues up. Im not trying to alarm him with my concern but also want to be diligent in workup. I think close follow up may help and 1 advised 1 month follow up   #CAD with high lipoprotein a #hyperlipidemia S: Medication:took his first Repatha  and did well with it. Also taking aspirin .  On rosuvastatin  20 mg daily as well Lab Results  Component Value Date   CHOL 190 01/30/2024   HDL  46 01/30/2024   LDLCALC 122 (H) 01/30/2024   LDLDIRECT 221.0 11/25/2022   TRIG 120 01/30/2024   CHOLHDL 4.1 01/30/2024   A/P: Patient still exhibits a fair amount of anxiety related to his new coronary disease diagnosis and ultimate changes including the Repatha  that he is having to take and give himself injections-I congratulated him on completing his first and encouraged him to continue.  Also continue aspirin  -Suspect lipids improving-encouraged him to continue rosuvastatin  unless told otherwise by cardiology  #also reports cold feet in last 2-3 months. Good pulses 2+ in bilateral feet. With CAD history we will get ankle-brachial index to be cautious especially as smoker- as always would advise smoking cessation  #Left arm numbness - left arm if lays off edge of bed gets numb down to fingers or if holding remotes up- can happen in both arms- advised to avoid that position. Is painful on medial epicondyle as well and suspect that's golfers elbow -not exertional and doubt cardiac. Discussed avoiding trigger positions. Also appears to have golfers elbow- handout given and advsisement for sports medicine if not improving   Recommended follow up: Return in about 1 month (around 04/05/2024) for followup or sooner if needed.Schedule b4 you leave. Future Appointments  Date Time Provider Department Center  03/09/2024  9:00 AM Hollace Richerd CROME, LMFT [facility] None  04/02/2024  9:50 AM Tobie Breslow K, DO [facility] None   04/06/2024  2:00 PM Katrinka Garnette KIDD, MD [facility]  04/23/2024  9:20 AM Azobou Donley Joelle DEL, MD [facility]    Lab/Order associations:   ICD-10-CM   1. Bilateral cold feet  R20.9 VAS US  ABI WITH/WO TBI    2. GAD (generalized anxiety disorder)  F41.1     3. Coronary artery disease involving native coronary artery of native heart without angina pectoris  I25.10     4. Palpitations  R00.2     5. Dizziness  R42     6. Stress-related physiological response affecting medical condition  F54      No orders of the defined types were placed in this encounter.  I personally spent a total of 50 minutes in the care of the patient today including preparing to see the patient, getting/reviewing separately obtained history, performing a medically appropriate exam/evaluation, counseling and educating, placing orders, and documenting clinical information in the EHR.   Return precautions advised.  Garnette Katrinka, MD

## 2024-03-09 ENCOUNTER — Ambulatory Visit: Admitting: Behavioral Health

## 2024-03-09 DIAGNOSIS — F411 Generalized anxiety disorder: Secondary | ICD-10-CM | POA: Diagnosis not present

## 2024-03-09 NOTE — Progress Notes (Signed)
"    [facility] Counselor/Therapist Progress Note  Patient ID: Daryl Pace, MRN: [id number],    Date: 03/09/2024  Time Spent: 45 min Caregility video; Pt is home in private & Provider working remotely from [employer]. Pt is aware of the risks/limitations of telehealth & consents to Tx today. Time In: 9;00am  Time Out: 9: 45am  Treatment Type: Individual Therapy  Reported Symptoms: Elevated anx/dep & stress  Mental Status Exam: Appearance:  Casual     Behavior: Appropriate and Sharing  Motor: Normal  Speech/Language:  Clear and Coherent  Affect: Appropriate  Mood: normal  Thought process: normal  Thought content:   WNL  Sensory/Perceptual disturbances:   WNL  Orientation: oriented to person  Attention: Good  Concentration: Good  Memory: WNL  Fund of knowledge:  Good  Insight:   Good  Judgment:  Good  Impulse Control: Good   Risk Assessment: Danger to Self:  No Self-injurious Behavior: No Danger to Others: No Duty to Warn:no Physical Aggression / Violence:No  Access to Firearms a concern: No  Gang Involvement:No   Subjective: Pt reports feeling better & carries a lot of gut butterflies that keep him feeling tight. He identifies today his level of responsibility in the Family.  Interventions: Family Systems  Diagnosis:GAD (generalized anxiety disorder)  Plan: Saiquan will try to focus on others in his Family to distract him from being anxious. He will lean into his Sig Other Deanna to help him cope through communication. His Dtr is an important part of their coping.   Target Date: 04/05/2024  Progress: 6  Frequency: Once every 2-3 wks  Modality: Kennis Richerd LITTIE Hollace, LMFT    "

## 2024-03-12 ENCOUNTER — Ambulatory Visit (HOSPITAL_COMMUNITY)
Admission: RE | Admit: 2024-03-12 | Discharge: 2024-03-12 | Disposition: A | Discharge status: Home / Self Care | Source: Ambulatory Visit | Attending: Family Medicine | Admitting: Family Medicine

## 2024-03-12 ENCOUNTER — Ambulatory Visit: Payer: Self-pay | Admitting: Family Medicine

## 2024-03-12 DIAGNOSIS — R209 Unspecified disturbances of skin sensation: Secondary | ICD-10-CM | POA: Insufficient documentation

## 2024-03-12 LAB — VAS US ABI WITH/WO TBI
Left ABI: 0.96
Right ABI: 0.98

## 2024-04-02 ENCOUNTER — Ambulatory Visit: Payer: Self-pay | Admitting: Neurology

## 2024-04-02 ENCOUNTER — Encounter: Payer: Self-pay | Admitting: Neurology

## 2024-04-02 VITALS — BP 147/82 | HR 104 | Ht 72.0 in | Wt 176.0 lb

## 2024-04-02 DIAGNOSIS — R42 Dizziness and giddiness: Secondary | ICD-10-CM
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
## 2024-04-05 ENCOUNTER — Ambulatory Visit: Admitting: Behavioral Health

## 2024-04-05 DIAGNOSIS — F411 Generalized anxiety disorder: Secondary | ICD-10-CM | POA: Diagnosis not present

## 2024-04-05 NOTE — Progress Notes (Signed)
"    [facility] Counselor/Therapist Progress Note  Patient ID: Nekoda Chock, MRN: [id number],    Date: 04/05/2024  Time Spent: 50 min Caregility video; Pt is home in [place] & Provider working remotely from [employer]. Pt is aware of the risks/limitations of telehealth & consents to Tx today. Time In: 1:00pm Time Out: 1:50pm   Treatment Type: Individual Therapy  Reported Symptoms: Elevated anx/dep & stress  Mental Status Exam: Appearance:  Casual     Behavior: Appropriate and Sharing  Motor: Normal  Speech/Language:  Clear and Coherent  Affect: Appropriate  Mood: anxious  Thought process: normal  Thought content:   WNL  Sensory/Perceptual disturbances:   WNL  Orientation: oriented to person, place, time/date, and situation  Attention: Good  Concentration: Good  Memory: WNL  Fund of knowledge:  Good  Insight:   Good  Judgment:  Good  Impulse Control: Good   Risk Assessment: Danger to Self:  No Self-injurious Behavior: No Danger to Others: No Duty to Warn:no Physical Aggression / Violence:No  Access to Firearms a concern: No  Gang Involvement:No   Subjective: Pt has been exp'g a degree of nervousness as a Scientist, Research (life Sciences) in a librarian, academic. This has been a different exp in the past 10 yrs.    Interventions: Family Systems  Diagnosis:GAD (generalized anxiety disorder)  Plan: Adriana is working around the needs of his Family. He is trying to approach his driving issues & his anxiety by being flexible. This has allowed him to adapt to his limitations well.   Target Date: 05/06/2024  Progress: 6  Frequency: Once every 2-3 wks  Modality: Kennis Richerd LITTIE Hollace, LMFT    "

## 2024-04-06 ENCOUNTER — Encounter: Payer: Self-pay | Admitting: Family Medicine

## 2024-04-06 ENCOUNTER — Ambulatory Visit: Admitting: Family Medicine

## 2024-04-06 VITALS — BP 122/78 | HR 84 | Temp 98.9°F | Ht 72.0 in | Wt 173.0 lb

## 2024-04-06 DIAGNOSIS — F411 Generalized anxiety disorder: Secondary | ICD-10-CM

## 2024-04-06 DIAGNOSIS — R7303 Prediabetes: Secondary | ICD-10-CM

## 2024-04-06 DIAGNOSIS — I10 Essential (primary) hypertension: Secondary | ICD-10-CM

## 2024-04-06 DIAGNOSIS — Z131 Encounter for screening for diabetes mellitus: Secondary | ICD-10-CM

## 2024-04-06 DIAGNOSIS — K219 Gastro-esophageal reflux disease without esophagitis: Secondary | ICD-10-CM | POA: Diagnosis not present

## 2024-04-06 DIAGNOSIS — E785 Hyperlipidemia, unspecified: Secondary | ICD-10-CM

## 2024-04-06 DIAGNOSIS — F17200 Nicotine dependence, unspecified, uncomplicated: Secondary | ICD-10-CM | POA: Diagnosis not present

## 2024-04-06 NOTE — Patient Instructions (Addendum)
 Sorry the medicines have not been tolerable but we appreciate you trying- lets see what psychiatry says. At least on the medical front everything has been ruled out so we can focus on mental health now.   As always quit smoking- one of the best things you can do for your long term health  Please stop by lab before you go If you have mychart- we will send your results within 3 business days of us  receiving them.  If you do not have mychart- we will call you about results within 5 business days of us  receiving them.  *please also note that you will see labs on mychart as soon as they post. I will later go in and write notes on them- will say notes from Dr. Katrinka   Recommended follow up: Return in about 2 months (around 06/04/2024) for followup or sooner if needed.Schedule b4 you leave.

## 2024-04-06 NOTE — Progress Notes (Signed)
 " Phone [phone] In person visit   Subjective:   Daryl Pace is a 49 y.o. year old very pleasant male patient who presents for/with See problem oriented charting Chief Complaint  Patient presents with   Follow-up   Anxiety    Here for a 1 month follow-up due to change in anxiety med. Still having issues with the med.     Past Medical History-  Patient Active Problem List   Diagnosis Date Noted   Current smoker 11/20/2014    Priority: High   Palpitations 03/31/2022    Priority: Medium    Eczema 05/22/2018    Priority: Medium    GAD (generalized anxiety disorder) 10/01/2017    Priority: Medium    Atypical chest pain 07/10/2015    Priority: Medium    Hyperlipidemia 03/06/2015    Priority: Medium    Hypertension     Priority: Medium    Plantar fibromatosis 11/22/2018    Priority: Low   CAD (coronary artery disease) 01/12/2024    Medications- reviewed and updated Current Outpatient Medications  Medication Sig Dispense Refill   aspirin  EC 81 MG tablet Take 1 tablet (81 mg total) by mouth daily. Swallow whole.     Evolocumab  (REPATHA  SURECLICK) 140 MG/ML SOAJ Inject 140 mg into the skin every 14 (fourteen) days. 2 mL 2   hydrOXYzine  (ATARAX ) 25 MG tablet Take 25 mg by mouth 3 (three) times daily.     omeprazole  (PRILOSEC) 40 MG capsule Take 1 capsule (40 mg total) by mouth daily. 30 capsule 3   rosuvastatin  (CRESTOR ) 20 MG tablet Take 1 tablet (20 mg total) by mouth daily. 90 tablet 3   Simethicone (GAS-X PO) Take 1 tablet by mouth as needed.     No current facility-administered medications for this visit.     Objective:  BP 122/78 (BP Location: Left Arm, Patient Position: Sitting, Cuff Size: Normal)   Pulse 84   Temp 98.9 F (37.2 C) (Temporal)   Ht 6' (1.829 m)   Wt 173 lb (78.5 kg)   SpO2 98%   BMI 23.46 kg/m  Gen: NAD, resting comfortably CV: RRR no murmurs rubs or gallops Lungs: CTAB no crackles, wheeze, rhonchi Ext: no edema Skin: warm,  dry Psychiatry: visibly anxious appearing in room - though he states at end of visit mildly improved    Assessment and Plan   #Generalized anxiety disorder-often presenting with multiple physical symptoms including dizziness, palpitations, fluttering feeling S: Medication: Unfortunately doesn't tolerate SSRI-Lexapro  emotional restriction, sertraline  25 mg feels like a zombie-trialing 12.5 mg but reports felt like a zombie even at that low of a dose- if takes 8 pm feels that way until 3 pm next day. He stopped taking the medicine.   -buspirone  limited benefit -Palpitations with hydroxyzine  in past but may retrial - anxious with haircut and driving  in particular- makes it thorough haircut but very intense anxiety towards the end - Was finally able to get in with psychiatry on February 2-strongly encouraged him to keep this appointment - Also working with therapist Dr. Hollace- finds mild benefit but still early  -still getting dizzy/fluttery feel with anxiety- was cleared by neurology yesterday. At least palpitatoins have been better with omeprazole  breore breakfast.     04/06/2024    2:34 PM 03/05/2024    4:01 PM 01/27/2024   10:27 AM  Depression screen PHQ 2/9  Decreased Interest 1 0 0  Down, Depressed, Hopeless 1 1 1   PHQ - 2 Score 2  1 1  Altered sleeping 1 0 0  Tired, decreased energy 1 1 1   Change in appetite 0 1 1  Feeling bad or failure about yourself  0 0 0  Trouble concentrating 0 0 0  Moving slowly or fidgety/restless 1 0 0  Suicidal thoughts 0 0 0  PHQ-9 Score 5 3 3   Difficult doing work/chores Somewhat difficult Somewhat difficult Not difficult at all       04/06/2024    2:34 PM 03/05/2024    4:01 PM 01/27/2024   10:28 AM 01/10/2024    9:33 AM  GAD 7 : Generalized Anxiety Score  Nervous, Anxious, on Edge 2 1 1 1   Control/stop worrying 2 1 1 1   Worry too much - different things 2 1 1 1   Trouble relaxing 1 0 0 1  Restless 0 0 0 0  Easily annoyed or irritable 0 1 1  0  Afraid - awful might happen 1 1 1 1   Total GAD 7 Score 8 5 5 5   Anxiety Difficulty Somewhat difficult Somewhat difficult Somewhat difficult Somewhat difficult   A/P: generalized anxiety disorder ongoing with poor control of GAD7 and honestly I think this is underreported- unfortunately first line treatment(s) have significant side effects - multiple SSRI trials- we are waiting on pscyhiatry visit early next month and I still think it may take another 1-2 months beyond that to find right medication etc to kget him back to work - since his palpitations are better on omeprazole  he may retrial the hydroxyzine  -He has been out of work sometime and discussed I am hoping within 1 to 2 months of psychiatry visit we can find a better solution for him- thankful he's scheduled for early february -We have not recently updated labs and with ongoing anxiety we opted to check CBC, CMP, TSH  # GERD S:Medication: omeprazole  40 mg  A/P: has done well with this with less reflux (chest pain, left sided rib pain)  as well as  fewer palpitations and easier for him to eat- would like to go down on this dose eventually but do not want to throw a kink in his progress right now- continue current medications for now   #hypertension  S: medication: None/diet controlled - Had required lisinopril  in the past but had weight loss and blood pressure improved BP Readings from Last 3 Encounters:  04/06/24 122/78  04/02/24 (!) 147/82  03/05/24 128/70  A/P: Blood pressure remains well-controlled-continue current medication  # Smoking-strongly encouraged smoking cessation-discussed potential risks of ongoing smoking including heart disease and erectile dysfunction  Recommended follow up: Return in about 2 months (around 06/04/2024) for followup or sooner if needed.Schedule b4 you leave. Future Appointments  Date Time Provider Department Center  04/23/2024  9:20 AM Azobou Donley Joelle DEL, MD [facility]  04/30/2024  9:00 AM  Izella Ismael NOVAK, MD [facility] None  06/04/2024  1:00 PM Katrinka Garnette KIDD, MD [facility]    Lab/Order associations:   ICD-10-CM   1. GAD (generalized anxiety disorder)  F41.1     2. Prediabetes  R73.03 Hemoglobin A1c    Hemoglobin A1c    3. Current smoker  F17.200     4. Hyperlipidemia, unspecified hyperlipidemia type  E78.5 Comprehensive metabolic panel with GFR    CBC with Differential/Platelet    TSH    TSH    CBC with Differential/Platelet    Comprehensive metabolic panel with GFR    5. Screening for diabetes mellitus  Z13.1 Hemoglobin  A1c    Hemoglobin A1c    6. Primary hypertension  I10       No orders of the defined types were placed in this encounter.   Return precautions advised.  Garnette Lukes, MD  "

## 2024-04-07 LAB — COMPREHENSIVE METABOLIC PANEL WITH GFR
AG Ratio: 2.4 (calc) (ref 1.0–2.5)
ALT: 32 U/L (ref 9–46)
AST: 14 U/L (ref 10–40)
Albumin: 5.1 g/dL (ref 3.6–5.1)
Alkaline phosphatase (APISO): 68 U/L (ref 36–130)
BUN: 16 mg/dL (ref 7–25)
CO2: 32 mmol/L (ref 20–32)
Calcium: 10.4 mg/dL — ABNORMAL HIGH (ref 8.6–10.3)
Chloride: 102 mmol/L (ref 98–110)
Creat: 0.88 mg/dL (ref 0.60–1.29)
Globulin: 2.1 g/dL (ref 1.9–3.7)
Glucose, Bld: 89 mg/dL (ref 65–99)
Potassium: 4.8 mmol/L (ref 3.5–5.3)
Sodium: 141 mmol/L (ref 135–146)
Total Bilirubin: 0.4 mg/dL (ref 0.2–1.2)
Total Protein: 7.2 g/dL (ref 6.1–8.1)
eGFR: 106 mL/min/1.73m2

## 2024-04-07 LAB — CBC WITH DIFFERENTIAL/PLATELET
Absolute Lymphocytes: 1175 {cells}/uL (ref 850–3900)
Absolute Monocytes: 581 {cells}/uL (ref 200–950)
Basophils Absolute: 32 {cells}/uL (ref 0–200)
Basophils Relative: 0.7 %
Eosinophils Absolute: 113 {cells}/uL (ref 15–500)
Eosinophils Relative: 2.5 %
HCT: 53.1 % — ABNORMAL HIGH (ref 39.4–51.1)
Hemoglobin: 16.6 g/dL (ref 13.2–17.1)
MCH: 27.4 pg (ref 27.0–33.0)
MCHC: 31.3 g/dL — ABNORMAL LOW (ref 31.6–35.4)
MCV: 87.6 fL (ref 81.4–101.7)
MPV: 10.2 fL (ref 7.5–12.5)
Monocytes Relative: 12.9 %
Neutro Abs: 2601 {cells}/uL (ref 1500–7800)
Neutrophils Relative %: 57.8 %
Platelets: 318 Thousand/uL (ref 140–400)
RBC: 6.06 Million/uL — ABNORMAL HIGH (ref 4.20–5.80)
RDW: 12.8 % (ref 11.0–15.0)
Total Lymphocyte: 26.1 %
WBC: 4.5 Thousand/uL (ref 3.8–10.8)

## 2024-04-07 LAB — HEMOGLOBIN A1C
Hgb A1c MFr Bld: 5.6 %
Mean Plasma Glucose: 114 mg/dL
eAG (mmol/L): 6.3 mmol/L

## 2024-04-07 LAB — TSH: TSH: 1.63 m[IU]/L (ref 0.40–4.50)

## 2024-04-09 ENCOUNTER — Ambulatory Visit: Payer: Self-pay | Admitting: Family Medicine

## 2024-04-17 NOTE — Progress Notes (Unsigned)
 " Psychiatric Initial Adult Assessment  Patient Identification: Daryl Pace MRN:  [id number] Date of Evaluation:  04/17/2024  Assessment: ***  Plan:  # Generalized anxiety disorder Prior medication trials: Lexapro  (emotional restriction), Zoloft  (Zombie like at 12.5 mg), BuSpar  (ineffective), hydroxyzine  (palpitations but resolved with omeprazole ) - *** - Therapy with Dr. Hollace - CMP and CBC unremarkable, A1c 5.6, TSH WNL, lipid LDL 122 - Medically cleared by Neurology  # *** - ***  # *** - ***  Patient was given contact information for behavioral health clinic and was instructed to call 911 for emergencies.   Identifying Information: Daryl Pace is a 49 y.o. male with a history of GAD who presents in person to [facility] for anxiety.    Subjective:  History of Present Illness:    Patient referred to the [facility] clinic by his PCP due to anxiety with multiple physical symptoms including dizziness, palpitations, and fluttering feeling.  Patient reports feeling ***.   Patient denies current SI, HI, and AVH. ***   I discussed the risks/benefits/possible adverse effects of starting ***.    Mood Symptoms: *** Anxiety Symptoms: *** Manic Symptoms: *** Psychosis Symptoms: ***  Chart review: ***  Past Psychiatric History:  Diagnoses: *** Previous medications: *** Previous psychiatrist: *** Previous therapist: ***  Hospitalizations: *** Suicide attempts: *** SIB: *** Current access to guns: ***  Hx of violence towards others: *** Hx of trauma/abuse: ***  Substance use:  Tobacco: *** Alcohol: *** Marijuana: *** Other illicit substances: ***  Past Medical History:  Dx: *** Medications: *** PCP: ***  Family Psychiatric History: ***  Social History:  Living: *** Occupation: *** Relationship: *** Children: *** Support: *** Legal History: ***  Past Medical History:  Past Medical History:  Diagnosis Date   Cancer  (HCC) 1.5 years   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   Chicken pox    Coronary artery disease    Artery blockage   Hyperlipidemia 2016   Hypertension    diagnosed at work screenings   Kidney stone    around age 75, peed them out    Past Surgical History:  Procedure Laterality Date   HERNIA REPAIR     around 65, bilateral    Family History:  Family History  Problem Relation Age of Onset   Stroke Mother        early 48s, drop foot   Hypertension Mother    Diabetes Mother    Hyperlipidemia Mother    Hyperlipidemia Brother    Hypertension Brother    Breast cancer Maternal Aunt     Social History   Socioeconomic History   Marital status: Single    Spouse name: Not on file   Number of children: Not on file   Years of education: Not on file   Highest education level: Some college, no degree  Occupational History   Not on file  Tobacco Use   Smoking status: Every Day    Current packs/day: 0.60    Average packs/day: 0.6 packs/day for 28.0 years (16.8 ttl pk-yrs)    Types: Cigarettes    Passive exposure: Current   Smokeless tobacco: Never  Vaping Use   Vaping status: Never Used  Substance and Sexual Activity   Alcohol use: Yes    Alcohol/week: 7.0 - 12.0 standard drinks of alcohol    Types: 2 Cans of beer, 5 - 10 Standard drinks or equivalent per week   Drug use: No   Sexual activity: Yes  Partners: Female  Other Topics Concern   Not on file  Social History Narrative   Family: Single. Girlfriend and 3 kids (older daughter plus 2 with girlfriend). 4 people in home. Son that lives in [place]- good situation with former partner.       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       Hobbies: football, basketball with son at park, [place]       Right handed   Lives in two story home   Social Drivers of Health   Tobacco Use: High Risk (04/06/2024)   Patient History     Smoking Tobacco Use: Every Day    Smokeless Tobacco Use: Never    Passive Exposure: Current  Financial Resource Strain: Low Risk (03/05/2024)   Overall Financial Resource Strain (CARDIA)    Difficulty of Paying Living Expenses: Not very hard  Food Insecurity: No Food Insecurity (03/05/2024)   Epic    Worried About Radiation Protection Practitioner of Food in the Last Year: Never true    Ran Out of Food in the Last Year: Never true  Transportation Needs: No Transportation Needs (03/05/2024)   Epic    Lack of Transportation (Medical): No    Lack of Transportation (Non-Medical): No  Physical Activity: Insufficiently Active (03/05/2024)   Exercise Vital Sign    Days of Exercise per Week: 1 day    Minutes of Exercise per Session: 10 min  Stress: Stress Concern Present (03/05/2024)   [employer] of Occupational Health - Occupational Stress Questionnaire    Feeling of Stress: To some extent  Social Connections: Moderately Isolated (03/05/2024)   Social Connection and Isolation Panel    Frequency of Communication with Friends and Family: Three times a week    Frequency of Social Gatherings with Friends and Family: Never    Attends Religious Services: 1 to 4 times per year    Active Member of Clubs or Organizations: No    Attends Banker Meetings: Not on file    Marital Status: Never married  Depression (PHQ2-9): Medium Risk (04/06/2024)   Depression (PHQ2-9)    PHQ-2 Score: 5  Alcohol Screen: Medium Risk (03/05/2024)   Alcohol Screen    Last Alcohol Screening Score (AUDIT): 9  Housing: Low Risk (03/05/2024)   Epic    Unable to Pay for Housing in the Last Year: No    Number of Times Moved in the Last Year: 0    Homeless in the Last Year: No  Utilities: Not on file  Health Literacy: Not on file    Allergies: Allergies[1]  Current Medications: Current Outpatient Medications  Medication Sig Dispense Refill   aspirin  EC 81 MG tablet Take 1 tablet (81 mg total) by mouth daily. Swallow  whole.     Evolocumab  (REPATHA  SURECLICK) 140 MG/ML SOAJ Inject 140 mg into the skin every 14 (fourteen) days. 2 mL 2   hydrOXYzine  (ATARAX ) 25 MG tablet Take 25 mg by mouth 3 (three) times daily.     omeprazole  (PRILOSEC) 40 MG capsule Take 1 capsule (40 mg total) by mouth daily. 30 capsule 3   rosuvastatin  (CRESTOR ) 20 MG tablet Take 1 tablet (20 mg total) by mouth daily. 90 tablet 3   Simethicone (GAS-X PO) Take 1 tablet by mouth as needed.     No current facility-administered medications for this visit.    Objective:  Psychiatric Specialty Exam General Appearance: appears at stated age, casually dressed and groomed ***  Behavior: pleasant and cooperative ***  Psychomotor Activity: no psychomotor agitation or retardation noted ***  Eye Contact: fair *** Speech: normal amount, tone, volume and fluency ***   Mood: euthymic *** Affect: congruent, pleasant and interactive ***  Thought Process: linear, goal directed, no circumstantial or tangential thought process noted, no racing thoughts or flight of ideas *** Descriptions of Associations: intact ***  Thought Content Hallucinations: denies AH, VH , does not appear responding to stimuli *** Delusions: no paranoia, delusions of control, grandeur, ideas of reference, thought broadcasting, and magical thinking *** Suicidal Thoughts: denies SI, intention, plan *** Homicidal Thoughts: denies HI, intention, plan ***  Alertness/Orientation: alert and fully oriented ***  Insight: fair*** Judgment: fair***  Memory: intact ***  Executive Functions  Concentration: intact *** Attention Span: fair *** Recall: intact *** Fund of Knowledge: fair ***  Physical Exam *** General: Pleasant, well-appearing ***. No acute distress. Pulmonary: Normal effort. No wheezing or rales. Skin: No obvious rash or lesions. Neuro: A&Ox3.No focal deficit.  Review of Systems *** No reported symptoms  Metabolic Disorder Labs: Lab Results   Component Value Date   HGBA1C 5.6 04/06/2024   MPG 114 04/06/2024   No results found for: PROLACTIN Lab Results  Component Value Date   CHOL 190 01/30/2024   TRIG 120 01/30/2024   HDL 46 01/30/2024   CHOLHDL 4.1 01/30/2024   VLDL 56.1 (H) 07/12/2023   LDLCALC 122 (H) 01/30/2024   LDLCALC 199 (H) 07/12/2023   Lab Results  Component Value Date   TSH 1.63 04/06/2024    Therapeutic Level Labs: No results found for: LITHIUM No results found for: CBMZ No results found for: VALPROATE  Screenings:  AUDIT    Flowsheet Row Office Visit from 03/05/2024 in [facility] [place]  Alcohol Use Disorder Identification Test Final Score (AUDIT) 9    GAD-7    Flowsheet Row Office Visit from 04/06/2024 in [facility] [employer] from 03/05/2024 in [facility] [place] [employer] from 01/27/2024 in [facility] [employer] from 01[facility]10/2024 in [facility] [employer] Visit from 11/25/2022 in [facility] [place]  Total GAD-7 Score 8 5 5 5 4    [employer]    Flowsheet Row Office Visit from 04/06/2024 in [facility] [employer] Visit from 03/05/2024 in [facility] [place] [employer] Visit from 01/27/2024 in [facility] [place] [facility] [place] [employer] Visit from 01/10/2024 in [facility] [employer] Visit from 12/16/2023 in [facility] [place]  PHQ-2 Total Score 2 1 1 1  0  PHQ-9 Total Score 5 3 3 2  --   Flowsheet Row ED from 12/13/2023 in [facility] at [facility] ED from 07/03/2023 in [facility] Department at [facility]  C-SSRS RISK CATEGORY No Risk No Risk    Collaboration of Care: Case discussed with attending, see  attending's attestation for additional information.  Consent: Patient/Guardian gives verbal consent for treatment and assignment of benefits for services provided during this visit. Patient/Guardian expressed understanding and agreed to proceed.   Ismael Franco, MD PGY-3 Psychiatry Resident     [1] No  Known Allergies  "

## 2024-04-17 NOTE — Progress Notes (Unsigned)
" °   °  °  Cardiology Office Note Date:  04/17/2024  ID:  Jourdan, D[place] 02-07-1976, MRN [id number] PCP:  Katrinka Garnette KIDD, MD  Cardiologist:  Joelle VEAR Ren Donley, MD  No chief complaint on file.     Problems CP Coronary CTA 9/25- RCA CTO TTE 10/25- 55-60%, GIDD Stopped Imdur  due to dizziness Dizziness: EM 10/25 unremarkable Carotid Doppler 10/25 with mild plaque Tobacco use 7.8 pack year Elevated Lpa 446; LDL goal < 55 M: ASA81, EM140, RN20,   Visits  9/25: CTCA, TTE, 7 day event monitor, RN10 11/25: RN to 20, Lpa 2/26: Genetic testing, LDL 122 11/25, LP in 4/26     ROS: Otherwise negative Discussed the use of AI scribe software for clinical note transcription with the patient, who gave verbal consent to proceed.  History of Present Illness     Physical Exam VS:  There were no vitals taken for this visit. , BMI There is no height or weight on file to calculate BMI. GEN: Well nourished, well developed, in no acute distress HEENT: normal Neck: no JVD, carotid bruits, or masses Cardiac: ***RRR; no murmurs, rubs, or gallops,no edema  Respiratory:  CTAB bilaterally, normal work of breathing GI: soft, nontender, nondistended, + BS Extremities: No LE edema Skin: warm and dry, no rash Neuro:  Strength and sensation are intact  Recent Labs: Reviewed  Assessment and Plan Assessment & Plan      Signed, Joelle VEAR Ren Donley, MD  04/17/2024 12:09 PM    [facility] "

## 2024-04-18 ENCOUNTER — Other Ambulatory Visit: Payer: Self-pay

## 2024-04-19 ENCOUNTER — Telehealth: Payer: Self-pay | Admitting: Internal Medicine

## 2024-04-19 ENCOUNTER — Other Ambulatory Visit: Payer: Self-pay

## 2024-04-19 MED ORDER — REPATHA SURECLICK 140 MG/ML ~~LOC~~ SOAJ: 140.0000 mg | SUBCUTANEOUS | 11 refills | Status: AC

## 2024-04-19 NOTE — Telephone Encounter (Signed)
" °*  STAT* If patient is at the pharmacy, call can be transferred to refill team.   1. Which medications need to be refilled? (please list name of each medication and dose if known)   Evolocumab  (REPATHA  SURECLICK) 140 MG/ML SOAJ  2. Which pharmacy/location (including street and city if local pharmacy) is medication to be sent to?  [id number] - [place], [place] - [address]  3. Do they need a 30 day or 90 day supply?  90 day supply  "

## 2024-04-20 NOTE — Telephone Encounter (Signed)
 Rx refilled yesterday.

## 2024-04-20 NOTE — Progress Notes (Unsigned)
" °   °  °  Cardiology Office Note Date:  04/26/2024  ID:  Daryl Pace, [place] [birth year], MRN [id number] PCP:  Katrinka Garnette KIDD, MD  Cardiologist:  Joelle VEAR Ren Donley, MD  Chief Complaint  Patient presents with   Coronary Artery Disease      Problems CP Coronary CTA 9/25- RCA CTO TTE 10/25- 55-60%, GIDD Stopped Imdur  due to dizziness Dizziness: EM 10/25 unremarkable Carotid Doppler 10/25 with mild plaque Tobacco use 7.8 pack year Elevated Lpa 446; LDL goal < 55 M: ASA81, EM140, RN20,   Visits  9/25: CTCA, TTE, 7 day event monitor, RN10 11/25: RN to 20, Lpa 2/26: Genetic testing deferred, LDL 122 11/25, LP 4/25 given Repatha  since 12/25     ROS: Otherwise negative Discussed the use of AI scribe software for clinical note transcription with the patient, who gave verbal consent to proceed.  History of Present Illness Daryl Pace is a 49 year old male with coronary artery disease who presents with ongoing chest discomfort and elevated cholesterol levels.  He experiences ongoing chest discomfort, described as pain in the chest that occasionally radiates to his arm and armpit. The discomfort has decreased in intensity over time and sometimes occurs after eating, which he associates with gas relief. He experiences a sharp sting every other day, and the discomfort is alleviated by gas relief. He is currently taking Prilosec in the morning before eating, which he feels has helped with his symptoms.  He mentions difficulty driving due to his symptoms, although he is able to drive with accompaniment. His pulse rate was elevated to 114 upon arrival at the clinic but normalized after some time.  He has a history of elevated cholesterol levels, with a previous LP(a) level of over 200. He has been started on Repatha  injections, which he believes have helped reduce his cholesterol levels. He missed a dose recently but resumed his injections two days later.  He has three children,  which is relevant to his consideration of genetic testing for elevated LP(a) levels.   Physical Exam VS:  BP 126/70 (BP Location: Left Arm, Patient Position: Sitting, Cuff Size: Normal)   Pulse 96   Ht 6' (1.829 m)   Wt 178 lb 6.4 oz (80.9 kg)   SpO2 98%   BMI 24.20 kg/m  , BMI Body mass index is 24.2 kg/m. 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 Heart stable but risk persists due to blockage history. - Continue current management and monitor symptoms. - Encouraged follow-up with psychiatrist for further evaluation and management.  Hyperlipidemia with elevated lipoprotein(a) Cholesterol reduced by 50% with Repatha , target <0.55 due to blockage and elevated LPA. Genetic testing discussed, deferred due to privacy concerns. - Continue Repatha  as prescribed. - Ordered cholesterol check for April. - Discussed genetic testing for LPA with him, but deferred for now.   Signed, Joelle VEAR Ren Donley, MD  04/26/2024 11:59 AM    [facility] "

## 2024-04-21 ENCOUNTER — Other Ambulatory Visit: Payer: Self-pay | Admitting: Family Medicine

## 2024-04-23 ENCOUNTER — Ambulatory Visit

## 2024-04-23 DIAGNOSIS — E785 Hyperlipidemia, unspecified: Secondary | ICD-10-CM

## 2024-04-23 DIAGNOSIS — I2510 Atherosclerotic heart disease of native coronary artery without angina pectoris: Secondary | ICD-10-CM

## 2024-04-23 DIAGNOSIS — E7841 Elevated Lipoprotein(a): Secondary | ICD-10-CM

## 2024-04-23 DIAGNOSIS — F17200 Nicotine dependence, unspecified, uncomplicated: Secondary | ICD-10-CM

## 2024-04-24 ENCOUNTER — Telehealth: Payer: Self-pay | Admitting: Family Medicine

## 2024-04-25 NOTE — Telephone Encounter (Signed)
 Completed and faxed.

## 2024-04-26 ENCOUNTER — Ambulatory Visit

## 2024-04-26 ENCOUNTER — Ambulatory Visit: Admitting: Behavioral Health

## 2024-04-26 VITALS — BP 126/70 | HR 96 | Ht 72.0 in | Wt 178.4 lb

## 2024-04-26 DIAGNOSIS — E785 Hyperlipidemia, unspecified: Secondary | ICD-10-CM

## 2024-04-26 DIAGNOSIS — F17200 Nicotine dependence, unspecified, uncomplicated: Secondary | ICD-10-CM | POA: Diagnosis not present

## 2024-04-26 DIAGNOSIS — E7841 Elevated Lipoprotein(a): Secondary | ICD-10-CM | POA: Diagnosis not present

## 2024-04-26 DIAGNOSIS — F411 Generalized anxiety disorder: Secondary | ICD-10-CM

## 2024-04-26 DIAGNOSIS — I2510 Atherosclerotic heart disease of native coronary artery without angina pectoris: Secondary | ICD-10-CM | POA: Diagnosis not present

## 2024-04-26 NOTE — Patient Instructions (Signed)
 Medication Instructions:  None  *If you need a refill on your cardiac medications before your next appointment, please call your pharmacy*  Lab Work: Lipid Panel today.  If you have labs (blood work) drawn today and your tests are completely normal, you will receive your results only by: MyChart Message (if you have MyChart) OR A paper copy in the mail If you have any lab test that is abnormal or we need to change your treatment, we will call you to review the results.  Testing/Procedures: None   Follow-Up: At [facility], you and your health needs are our priority.  As part of our continuing mission to provide you with exceptional heart care, our providers are all part of one team.  This team includes your primary Cardiologist (physician) and Advanced Practice Providers or APPs (Physician Assistants and Nurse Practitioners) who all work together to provide you with the care you need, when you need it.  Your next appointment:   6 month(s)  Provider:   Joelle VEAR Ren Donley, MD    We recommend signing up for the patient portal called MyChart.  Sign up information is provided on this After Visit Summary.  MyChart is used to connect with patients for Virtual Visits (Telemedicine).  Patients are able to view lab/test results, encounter notes, upcoming appointments, etc.  Non-urgent messages can be sent to your provider as well.   To learn more about what you can do with MyChart, go to [email or web address].   Other Instructions None

## 2024-04-26 NOTE — Progress Notes (Unsigned)
"    [facility] Counselor/Therapist Progress Note  Patient ID: Daryl Pace, MRN: [id number],    Date: 04/26/2024  Time Spent: 50 min Caregility video; Pt is home w/privacy & Provider working remotely from [employer]. Pt is aware of the risks/limitations of telehealth & consents to Tx today. Time In: 1:00pm Time Out: 1:50pm  Treatment Type: Individual Therapy  Reported Symptoms: ***  Mental Status Exam: Appearance:  {PSY:22683}     Behavior: {PSY:21022743}  Motor: {PSY:22302}  Speech/Language:  {PSY:22685}  Affect: {PSY:22687}  Mood: {PSY:31886}  Thought process: {PSY:31888}  Thought content:   {PSY:714-250-3371}  Sensory/Perceptual disturbances:   {[phone]}  Orientation: {PSY:30297}  Attention: {PSY:22877}  Concentration: {[phone]}  Memory: {PSY:[phone]}  Fund of knowledge:  {[phone]}  Insight:   {PSY:([phone]}  Judgment:  {PSY:([phone] [phone]}  Impulse Control: {PSY:([phone] [phone]}   Risk Assessment: Danger to Self:  {PSY:22692} Self-injurious Behavior: {PSY:22692} Danger to Others: {PSY:22692} Duty to Warn:{PSY:311194} Physical Aggression / Violence:{PSY:21197} Access to Firearms a concern: {PSY:21197} Gang Involvement:{PSY:21197}  Subjective: Pt reports ______________________   Interventions: {PSY:838-321-1921}  Diagnosis:GAD (generalized anxiety disorder)  Plan: Daryl Pace will _________________  Target Date:05/20/2024  Progress:  Frequency:  Modality:   Daryl LITTIE Ling, LMFT    "

## 2024-04-27 ENCOUNTER — Ambulatory Visit: Payer: Self-pay

## 2024-04-27 LAB — LIPID PANEL
Chol/HDL Ratio: 2.3 ratio (ref 0.0–5.0)
Cholesterol, Total: 108 mg/dL (ref 100–199)
HDL: 46 mg/dL
LDL Chol Calc (NIH): 45 mg/dL (ref 0–99)
Triglycerides: 85 mg/dL (ref 0–149)
VLDL Cholesterol Cal: 17 mg/dL (ref 5–40)

## 2024-04-30 ENCOUNTER — Ambulatory Visit (HOSPITAL_COMMUNITY): Admitting: Psychiatry

## 2024-05-10 ENCOUNTER — Ambulatory Visit: Admitting: Behavioral Health

## 2024-06-04 ENCOUNTER — Ambulatory Visit: Admitting: Family Medicine
